# Patient Record
Sex: Male | Born: 1990 | Race: White | Hispanic: No | Marital: Single | Smoking: Former smoker
Health system: Southern US, Community
[De-identification: ages and names within clinical notes are randomized; demographics above are authoritative.]

## PROBLEM LIST (undated history)

## (undated) DIAGNOSIS — F419 Anxiety disorder, unspecified: Secondary | ICD-10-CM

## (undated) DIAGNOSIS — L729 Follicular cyst of the skin and subcutaneous tissue, unspecified: Secondary | ICD-10-CM

## (undated) HISTORY — DX: Anxiety disorder, unspecified: F41.9

## (undated) HISTORY — PX: CYST EXCISION: SHX5701

---
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
## 2010-09-02 ENCOUNTER — Encounter: Payer: Self-pay | Admitting: Gastroenterology

## 2010-12-27 NOTE — Consult Note (Signed)
[name], [name]                     ACCOUNT NO.:  [id number]   MEDICAL RECORD NO.:  [id number]                   PATIENT TYPE:  EMS   LOCATION:  [facility]                                 FACILITY:  [facility]   PHYSICIAN:  Gabrielle Dare. Janee Morn, M.D.             DATE OF BIRTH:  [birth year]   DATE OF CONSULTATION:  01/06/2003  DATE OF DISCHARGE:                                   CONSULTATION   REASON FOR CONSULTATION:  Closed head injury, status post skateboard fall.   HISTORY OF PRESENT ILLNESS:  The patient is an 20 year old white male who  was riding a skateboard before school this morning and fell down onto his  right shoulder and side.  The event was witnessed and he did not have loss  of consciousness but he is amnestic to the event at this time.  Currently he  is complaining of just right shoulder pain.  He did have some nausea and  vomiting in the emergency department and during his evaluation.  He was  found to have a right clavicle fracture and a CT scan of his head  demonstrated a small left temporoparietal subarachnoid hemorrhage.  The  patient's father is present and assisting with the history.   PAST MEDICAL HISTORY:  Negative.   PRIMARY CARE PHYSICIAN:  Tama Headings. Marina Goodell, M.D., [facility],  [place]   FAMILY HISTORY:  Both his parents are alive and well.   PAST SURGICAL HISTORY:  Negative.   MEDICATIONS AT HOME:  None.  His tetanus vaccination is up to date.   ALLERGIES:  No known drug allergies.   REVIEW OF SYSTEMS:  GENERAL:  Negative.  CARDIOVASCULAR:  Negative.  PULMONARY:  Negative.  GASTROINTESTINAL:  Negative.  MUSCULOSKELETAL:  He is  complaining of some right shoulder and right-sided pain.  The remainder of  the review of systems is negative.   PHYSICAL EXAMINATION:  VITAL SIGNS:  Pulse 67, blood pressure 132/68,  respirations 18, temperature 97.3, oxygen saturation 96% on room air.  SKIN:  Warm.  HEENT:  His pupils are equal and  reactive.  His extraocular muscles are  intact.  Note the pupils are 3 mm bilaterally.  The ears are normal  externally.  His scalp noted no lacerations.  NECK:  Nontender.  Trachea is in the midline.  CHEST:  Clear to auscultation bilaterally.  He has a 2-3 cm hematoma over  his right clavicle with some tenderness.  HEART:  Regular rate and rhythm.  ABDOMEN:  Soft and nontender with normal bowel sounds.  He has a right lower  quadrant abrasion and over his anterior superior iliac spine with some  minimal tenderness.  No active bleeding.  BACK:  No stepoffs but he does have some scattered abrasions over his right  posterior shoulder and back without any active hemorrhage.  RECTAL:  Deferred.  No external wound.  GENITALIA:  Normal external genitalia.  PELVIS:  Stable  to palpation.  EXTREMITIES:  He has some abrasions around his right elbow but no specific  bony point tenderness.  He has good range of motion.  NEUROLOGICAL:  GCS 15.  He is oriented but amnestic to the event.  Cranial  nerves II-XII intact.  Sensation and motor of the upper and lower  extremities are intact.  VASCULAR:  Carotid, radial and dorsalis pedis pulses are 2+ bilaterally.   LABORATORY DATA:  Review of the i-STAT is pending.  Chest x-ray and right  shoulder x-ray reveal a right clavicle fracture.  CT scan of his head shows  a left temporoparietal subarachnoid hemorrhage.   IMPRESSION:  This is an 20 year old white male status post fall from  skateboard with a right clavicle fracture and a small left temporoparietal  subarachnoid hemorrhage.  The above neurosurgical issue is discussed with  Dr. Maeola Harman from Neurosurgery who evaluated the patient's CT scan and  recommends transfer to [facility] for definitive pediatric neurosurgical care.  I discussed this myself with Dr. Vista Lawman, the receiving pediatric trauma  surgeon at [facility], who has directed [place] to transfer the patient to the  pediatric emergency  department there at [facility].  The patient's  father was updated with the plan of care and questions were answered.                                               Gabrielle Dare Janee Morn, M.D.    BET/MEDQ  D:  01/06/2003  T:  01/06/2003  Job:  [id number]

## 2011-01-23 ENCOUNTER — Other Ambulatory Visit: Payer: Self-pay | Admitting: Plastic Surgery

## 2011-01-23 ENCOUNTER — Ambulatory Visit (HOSPITAL_BASED_OUTPATIENT_CLINIC_OR_DEPARTMENT_OTHER)
Admission: RE | Admit: 2011-01-23 | Discharge: 2011-01-23 | Disposition: A | Discharge status: Home / Self Care | Payer: BC Managed Care – PPO | Source: Ambulatory Visit | Attending: Plastic Surgery | Admitting: Plastic Surgery

## 2011-01-23 DIAGNOSIS — Z01812 Encounter for preprocedural laboratory examination: Secondary | ICD-10-CM | POA: Insufficient documentation

## 2011-01-23 DIAGNOSIS — D1801 Hemangioma of skin and subcutaneous tissue: Secondary | ICD-10-CM | POA: Insufficient documentation

## 2011-01-23 DIAGNOSIS — L723 Sebaceous cyst: Secondary | ICD-10-CM | POA: Insufficient documentation

## 2011-01-23 DIAGNOSIS — D492 Neoplasm of unspecified behavior of bone, soft tissue, and skin: Secondary | ICD-10-CM | POA: Insufficient documentation

## 2011-01-23 LAB — POCT HEMOGLOBIN-HEMACUE: Hemoglobin: 17 g/dL (ref 13.0–17.0)

## 2011-01-26 LAB — TISSUE CULTURE: Culture: NO GROWTH

## 2011-01-26 LAB — WOUND CULTURE

## 2011-01-28 LAB — ANAEROBIC CULTURE

## 2011-07-08 DIAGNOSIS — L700 Acne vulgaris: Secondary | ICD-10-CM | POA: Insufficient documentation

## 2011-08-12 DIAGNOSIS — K460 Unspecified abdominal hernia with obstruction, without gangrene: Secondary | ICD-10-CM

## 2011-08-12 HISTORY — DX: Unspecified abdominal hernia with obstruction, without gangrene: K46.0

## 2012-09-13 ENCOUNTER — Encounter (INDEPENDENT_AMBULATORY_CARE_PROVIDER_SITE_OTHER): Payer: Self-pay | Admitting: Surgery

## 2012-09-13 ENCOUNTER — Ambulatory Visit (INDEPENDENT_AMBULATORY_CARE_PROVIDER_SITE_OTHER): Payer: BC Managed Care – PPO | Admitting: Surgery

## 2012-09-13 VITALS — BP 102/64 | HR 81 | Temp 97.7°F | Resp 18 | Ht 71.0 in | Wt 150.6 lb

## 2012-09-13 DIAGNOSIS — K4090 Unilateral inguinal hernia, without obstruction or gangrene, not specified as recurrent: Secondary | ICD-10-CM

## 2012-09-13 NOTE — Patient Instructions (Signed)

## 2012-09-13 NOTE — Progress Notes (Signed)
Patient ID: [name], male   DOB: 11/16/1990, 21 y.o.   MRN: [id number]  Chief Complaint  Patient presents with  . New Evaluation    eval ing hernia    HPI [name] is a 21 y.o. male.  Patient sent at request of Dr. Pickett of [facility]  for left groin bulge and pain. He developed pain in his left groin about 2 weeks ago while doing some lifting. The bulge continues to be present and its large or with exertion. He has mild to moderate discomfort in his left groin sharp in nature. This is made worse with straining. HPI  History reviewed. No pertinent past medical history.  Past Surgical History  Procedure Date  . Cyst excision 2011/2012    History reviewed. No pertinent family history.  Social History History  Substance Use Topics  . Smoking status: Current Every Day Smoker -- 0.2 packs/day    Types: Cigarettes  . Smokeless tobacco: Never Used  . Alcohol Use: Yes     Comment: Social    No Known Allergies  Current Outpatient Prescriptions  Medication Sig Dispense Refill  . doxycycline (VIBRAMYCIN) 100 MG capsule         Review of Systems Review of Systems  Constitutional: Negative for fever, chills and unexpected weight change.  HENT: Negative for hearing loss, congestion, sore throat, trouble swallowing and voice change.   Eyes: Negative for visual disturbance.  Respiratory: Negative for cough and wheezing.   Cardiovascular: Negative for chest pain, palpitations and leg swelling.  Gastrointestinal: Negative for nausea, vomiting, abdominal pain, diarrhea, constipation, blood in stool, abdominal distention, anal bleeding and rectal pain.  Genitourinary: Negative for hematuria and difficulty urinating.  Musculoskeletal: Negative for arthralgias.  Skin: Negative for rash and wound.  Neurological: Negative for seizures, syncope, weakness and headaches.  Hematological: Negative for adenopathy. Does not bruise/bleed easily.  Psychiatric/Behavioral: Negative  for confusion.    Blood pressure 102/64, pulse 81, temperature 97.7 F (36.5 C), temperature source Temporal, resp. rate 18, height 5' 11" (1.803 m), weight 150 lb 9.6 oz (68.312 kg).  Physical Exam Physical Exam  Constitutional: He is oriented to person, place, and time. He appears well-developed and well-nourished.  HENT:  Head: Normocephalic and atraumatic.  Eyes: EOM are normal. Pupils are equal, round, and reactive to light.  Neck: Normal range of motion. Neck supple.  Cardiovascular: Normal rate and regular rhythm.   Pulmonary/Chest: Effort normal and breath sounds normal.  Abdominal: Soft. Bowel sounds are normal. A hernia is present.    Musculoskeletal: Normal range of motion.  Neurological: He is alert and oriented to person, place, and time.  Skin: Skin is warm and dry.  Psychiatric: He has a normal mood and affect. His behavior is normal. Judgment and thought content normal.    Data Reviewed Office note from [facility]  Assessment    Left inguinal hernia reducible symptomatic    Plan    Recommend repair left inguinal hernia with mesh. Laparoscopic and open repair discussed. Risk of each discussed. Complications of these discussed. He wishes to proceed with open left inguinal hernia repair with mesh.The risk of hernia repair include bleeding,  Infection,   Recurrence of the hernia,  Mesh use, chronic pain,  Organ injury,  Bowel injury,  Bladder injury,   nerve injury with numbness around the incision,  Death,  and worsening of preexisting  medical problems.  The alternatives to surgery have been discussed as well..  Long term expectations of both   operative and non operative treatments have been discussed.   The patient agrees to proceed.       Heela Heishman A. 09/13/2012, 2:05 PM    

## 2012-09-14 ENCOUNTER — Encounter (HOSPITAL_COMMUNITY): Payer: Self-pay | Admitting: Pharmacy Technician

## 2012-09-15 ENCOUNTER — Encounter (HOSPITAL_COMMUNITY)
Admission: RE | Admit: 2012-09-15 | Discharge: 2012-09-15 | Disposition: A | Discharge status: Home / Self Care | Payer: BC Managed Care – PPO | Source: Ambulatory Visit | Attending: Surgery | Admitting: Surgery

## 2012-09-15 ENCOUNTER — Encounter (HOSPITAL_COMMUNITY): Payer: Self-pay

## 2012-09-15 DIAGNOSIS — L729 Follicular cyst of the skin and subcutaneous tissue, unspecified: Secondary | ICD-10-CM

## 2012-09-15 HISTORY — DX: Follicular cyst of the skin and subcutaneous tissue, unspecified: L72.9

## 2012-09-15 LAB — SURGICAL PCR SCREEN
MRSA, PCR: NEGATIVE
Staphylococcus aureus: NEGATIVE

## 2012-09-15 NOTE — Patient Instructions (Addendum)
20 [name]  09/15/2012   Your procedure is scheduled on: 2-14  -2014  Report to [facility] at      0700  AM .  Call this number if you have problems the morning of surgery: [phone]  Or Presurgical Testing [phone]([place])      Do not eat food:After Midnight.    Take these medicines the morning of surgery with A SIP OF WATER: none   Do not wear jewelry, make-up or nail polish.  Do not wear lotions, powders, or perfumes. You may wear deodorant.  Do not shave 12 hours prior to first CHG shower(legs and under arms).(face and neck okay.)  Do not bring valuables to the hospital.  Contacts, dentures or bridgework,body piercing,  may not be worn into surgery.  Leave suitcase in the car. After surgery it may be brought to your room.  For patients admitted to the hospital, checkout time is 11:00 AM the day of discharge.   Patients discharged the day of surgery will not be allowed to drive home. Must have responsible person with you x 24 hours once discharged.  Name and phone number of your driver: parents-Robert/ Roney Youtz- 161- [phone]/ [phone] cell  Special Instructions: CHG Shower Use Special Wash: see special instructions.(avoid face and genitals)   Please read over the following fact sheets that you were given: MRSA Information.   Failure to follow these instructions may result in Cancellation of your surgery.   Patient signature_______________________________________________________

## 2012-09-24 ENCOUNTER — Encounter (HOSPITAL_COMMUNITY): Payer: Self-pay

## 2012-09-24 ENCOUNTER — Ambulatory Visit (HOSPITAL_COMMUNITY)
Admission: RE | Admit: 2012-09-24 | Discharge: 2012-09-24 | Disposition: A | Discharge status: Home / Self Care | Payer: BC Managed Care – PPO | Source: Ambulatory Visit | Attending: Surgery | Admitting: Surgery

## 2012-09-24 ENCOUNTER — Encounter (HOSPITAL_COMMUNITY): Payer: Self-pay | Admitting: Anesthesiology

## 2012-09-24 ENCOUNTER — Encounter (HOSPITAL_COMMUNITY)
Admission: RE | Disposition: A | Discharge status: Home / Self Care | Payer: Self-pay | Source: Ambulatory Visit | Attending: Surgery

## 2012-09-24 ENCOUNTER — Ambulatory Visit (HOSPITAL_COMMUNITY): Payer: BC Managed Care – PPO | Admitting: Anesthesiology

## 2012-09-24 DIAGNOSIS — K4090 Unilateral inguinal hernia, without obstruction or gangrene, not specified as recurrent: Secondary | ICD-10-CM

## 2012-09-24 HISTORY — PX: INGUINAL HERNIA REPAIR: SHX194

## 2012-09-24 HISTORY — PX: INSERTION OF MESH: SHX5868

## 2012-09-24 SURGERY — REPAIR, HERNIA, INGUINAL, ADULT
Anesthesia: General | Site: Groin | Laterality: Left | Wound class: Clean

## 2012-09-24 MED ORDER — FENTANYL CITRATE 0.05 MG/ML IJ SOLN
INTRAMUSCULAR | Status: DC | PRN
  Administered 2012-09-24: 100 ug via INTRAVENOUS
  Administered 2012-09-24 (×2): 50 ug via INTRAVENOUS

## 2012-09-24 MED ORDER — LACTATED RINGERS IV SOLN: INTRAVENOUS | Status: DC

## 2012-09-24 MED ORDER — ONDANSETRON HCL 4 MG/2ML IJ SOLN
INTRAMUSCULAR | Status: DC | PRN
  Administered 2012-09-24: 4 mg via INTRAVENOUS

## 2012-09-24 MED ORDER — FENTANYL CITRATE 0.05 MG/ML IJ SOLN
INTRAMUSCULAR | Status: AC
  Filled 2012-09-24: qty 2

## 2012-09-24 MED ORDER — ACETAMINOPHEN 10 MG/ML IV SOLN
INTRAVENOUS | Status: AC
  Filled 2012-09-24: qty 100

## 2012-09-24 MED ORDER — OXYCODONE-ACETAMINOPHEN 5-325 MG PO TABS: 1.0000 | ORAL_TABLET | ORAL | Status: DC | PRN

## 2012-09-24 MED ORDER — NEOSTIGMINE METHYLSULFATE 1 MG/ML IJ SOLN
INTRAMUSCULAR | Status: DC | PRN
  Administered 2012-09-24: 3 mg via INTRAVENOUS

## 2012-09-24 MED ORDER — BUPIVACAINE-EPINEPHRINE 0.25% -1:200000 IJ SOLN
INTRAMUSCULAR | Status: DC | PRN
  Administered 2012-09-24: 10 mL

## 2012-09-24 MED ORDER — SODIUM CHLORIDE 0.9 % IR SOLN
Status: DC | PRN
  Administered 2012-09-24: 1000 mL

## 2012-09-24 MED ORDER — PROPOFOL 10 MG/ML IV BOLUS
INTRAVENOUS | Status: DC | PRN
  Administered 2012-09-24: 170 mg via INTRAVENOUS

## 2012-09-24 MED ORDER — FENTANYL CITRATE 0.05 MG/ML IJ SOLN
25.0000 ug | INTRAMUSCULAR | Status: DC | PRN
  Administered 2012-09-24 (×2): 25 ug via INTRAVENOUS

## 2012-09-24 MED ORDER — LACTATED RINGERS IV SOLN
INTRAVENOUS | Status: DC | PRN
  Administered 2012-09-24 (×2): via INTRAVENOUS

## 2012-09-24 MED ORDER — CHLORHEXIDINE GLUCONATE 4 % EX LIQD
1.0000 "application " | Freq: Once | CUTANEOUS | Status: DC
  Filled 2012-09-24: qty 15

## 2012-09-24 MED ORDER — KETOROLAC TROMETHAMINE 30 MG/ML IJ SOLN
INTRAMUSCULAR | Status: DC | PRN
  Administered 2012-09-24: 30 mg via INTRAVENOUS

## 2012-09-24 MED ORDER — CEFAZOLIN SODIUM-DEXTROSE 2-3 GM-% IV SOLR
INTRAVENOUS | Status: AC
  Filled 2012-09-24: qty 50

## 2012-09-24 MED ORDER — OXYCODONE-ACETAMINOPHEN 5-325 MG PO TABS
1.0000 | ORAL_TABLET | ORAL | Status: DC | PRN
  Administered 2012-09-24: 1 via ORAL
  Filled 2012-09-24: qty 1

## 2012-09-24 MED ORDER — ACETAMINOPHEN 10 MG/ML IV SOLN
INTRAVENOUS | Status: DC | PRN
  Administered 2012-09-24: 1000 mg via INTRAVENOUS

## 2012-09-24 MED ORDER — MIDAZOLAM HCL 5 MG/5ML IJ SOLN
INTRAMUSCULAR | Status: DC | PRN
  Administered 2012-09-24: 2 mg via INTRAVENOUS

## 2012-09-24 MED ORDER — LIDOCAINE HCL (PF) 2 % IJ SOLN
INTRAMUSCULAR | Status: DC | PRN
  Administered 2012-09-24: 20 mg

## 2012-09-24 MED ORDER — SUCCINYLCHOLINE CHLORIDE 20 MG/ML IJ SOLN
INTRAMUSCULAR | Status: DC | PRN
  Administered 2012-09-24: 100 mg via INTRAVENOUS

## 2012-09-24 MED ORDER — CEFAZOLIN SODIUM-DEXTROSE 2-3 GM-% IV SOLR
2.0000 g | INTRAVENOUS | Status: AC
  Administered 2012-09-24: 2 g via INTRAVENOUS

## 2012-09-24 MED ORDER — BUPIVACAINE-EPINEPHRINE 0.25% -1:200000 IJ SOLN
INTRAMUSCULAR | Status: AC
  Filled 2012-09-24: qty 1

## 2012-09-24 MED ORDER — TRAMADOL HCL 50 MG PO TABS: 50.0000 mg | ORAL_TABLET | Freq: Four times a day (QID) | ORAL | Status: DC | PRN

## 2012-09-24 MED ORDER — GLYCOPYRROLATE 0.2 MG/ML IJ SOLN
INTRAMUSCULAR | Status: DC | PRN
  Administered 2012-09-24: .5 mg via INTRAVENOUS

## 2012-09-24 MED ORDER — ROCURONIUM BROMIDE 100 MG/10ML IV SOLN
INTRAVENOUS | Status: DC | PRN
  Administered 2012-09-24: 10 mg via INTRAVENOUS
  Administered 2012-09-24: 20 mg via INTRAVENOUS

## 2012-09-24 SURGICAL SUPPLY — 50 items
ADH SKN CLS APL DERMABOND .7 (GAUZE/BANDAGES/DRESSINGS) ×1
APL SKNCLS STERI-STRIP NONHPOA (GAUZE/BANDAGES/DRESSINGS)
BENZOIN TINCTURE PRP APPL 2/3 (GAUZE/BANDAGES/DRESSINGS) IMPLANT
BLADE HEX COATED 2.75 (ELECTRODE) ×2 IMPLANT
BLADE SURG 15 STRL LF DISP TIS (BLADE) ×1 IMPLANT
BLADE SURG 15 STRL SS (BLADE) ×2
CANISTER SUCTION 2500CC (MISCELLANEOUS) ×1 IMPLANT
CLOTH BEACON ORANGE TIMEOUT ST (SAFETY) ×2 IMPLANT
DECANTER SPIKE VIAL GLASS SM (MISCELLANEOUS) ×2 IMPLANT
DERMABOND ADVANCED (GAUZE/BANDAGES/DRESSINGS) ×1
DERMABOND ADVANCED .7 DNX12 (GAUZE/BANDAGES/DRESSINGS) IMPLANT
DRAIN PENROSE 18X1/2 LTX STRL (DRAIN) ×2 IMPLANT
DRAPE LAPAROTOMY TRNSV 102X78 (DRAPE) ×2 IMPLANT
ELECT REM PT RETURN 9FT ADLT (ELECTROSURGICAL) ×2
ELECTRODE REM PT RTRN 9FT ADLT (ELECTROSURGICAL) ×1 IMPLANT
GLOVE BIOGEL PI IND STRL 7.0 (GLOVE) ×1 IMPLANT
GLOVE BIOGEL PI INDICATOR 7.0 (GLOVE) ×1
GLOVE INDICATOR 8.0 STRL GRN (GLOVE) ×3 IMPLANT
GLOVE SS BIOGEL STRL SZ 8 (GLOVE) ×1 IMPLANT
GLOVE SUPERSENSE BIOGEL SZ 8 (GLOVE) ×1
GOWN STRL NON-REIN LRG LVL3 (GOWN DISPOSABLE) ×2 IMPLANT
GOWN STRL REIN XL XLG (GOWN DISPOSABLE) ×4 IMPLANT
KIT BASIN OR (CUSTOM PROCEDURE TRAY) ×2 IMPLANT
MESH ULTRAPRO 3X6 7.6X15CM (Mesh General) ×1 IMPLANT
NDL HYPO 25X1 1.5 SAFETY (NEEDLE) ×1 IMPLANT
NEEDLE HYPO 25X1 1.5 SAFETY (NEEDLE) ×2 IMPLANT
NS IRRIG 1000ML POUR BTL (IV SOLUTION) ×2 IMPLANT
PACK BASIC VI WITH GOWN DISP (CUSTOM PROCEDURE TRAY) ×2 IMPLANT
PENCIL BUTTON HOLSTER BLD 10FT (ELECTRODE) ×2 IMPLANT
SPONGE GAUZE 4X4 12PLY (GAUZE/BANDAGES/DRESSINGS) IMPLANT
SPONGE LAP 18X18 X RAY DECT (DISPOSABLE) ×2 IMPLANT
STRIP CLOSURE SKIN 1/2X4 (GAUZE/BANDAGES/DRESSINGS) IMPLANT
SUT MNCRL AB 4-0 PS2 18 (SUTURE) ×2 IMPLANT
SUT NOVA 0 T19/GS 22DT (SUTURE) IMPLANT
SUT NOVA NAB GS-21 0 18 T12 DT (SUTURE) ×3 IMPLANT
SUT SILK 2 0 SH (SUTURE) IMPLANT
SUT SILK 3 0 (SUTURE)
SUT SILK 3-0 18XBRD TIE 12 (SUTURE) IMPLANT
SUT VIC AB 0 CT1 36 (SUTURE) ×2 IMPLANT
SUT VIC AB 2-0 SH 27 (SUTURE) ×2
SUT VIC AB 2-0 SH 27X BRD (SUTURE) ×1 IMPLANT
SUT VIC AB 3-0 SH 18 (SUTURE) ×2 IMPLANT
SUT VIC AB 3-0 SH 27 (SUTURE) ×2
SUT VIC AB 3-0 SH 27XBRD (SUTURE) ×1 IMPLANT
SUT VICRYL 3 0 BR 18  UND (SUTURE) ×1
SUT VICRYL 3 0 BR 18 UND (SUTURE) ×1 IMPLANT
SYR BULB IRRIGATION 50ML (SYRINGE) ×2 IMPLANT
SYR CONTROL 10ML LL (SYRINGE) ×2 IMPLANT
TOWEL OR 17X26 10 PK STRL BLUE (TOWEL DISPOSABLE) ×2 IMPLANT
YANKAUER SUCT BULB TIP 10FT TU (MISCELLANEOUS) ×2 IMPLANT

## 2012-09-24 NOTE — Anesthesia Preprocedure Evaluation (Addendum)
Anesthesia Evaluation  Patient identified by MRN, date of birth, ID band Patient awake    Reviewed: Allergy & Precautions, H&P , NPO status , Patient's Chart, lab work & pertinent test results  Airway Mallampati: II TM Distance: >3 FB Neck ROM: full    Dental no notable dental hx. (+) Teeth Intact, Dental Advisory Given and Missing One missing tooth lower right :   Pulmonary neg pulmonary ROS, Current Smoker,  breath sounds clear to auscultation  Pulmonary exam normal       Cardiovascular Exercise Tolerance: Good negative cardio ROS  Rhythm:regular Rate:Normal     Neuro/Psych negative neurological ROS  negative psych ROS   GI/Hepatic negative GI ROS, Neg liver ROS,   Endo/Other  negative endocrine ROS  Renal/GU negative Renal ROS  negative genitourinary   Musculoskeletal   Abdominal   Peds  Hematology negative hematology ROS (+)   Anesthesia Other Findings   Reproductive/Obstetrics negative OB ROS                          Anesthesia Physical Anesthesia Plan  ASA: II  Anesthesia Plan: General   Post-op Pain Management:    Induction: Intravenous  Airway Management Planned: Oral ETT  Additional Equipment:   Intra-op Plan:   Post-operative Plan: Extubation in OR  Informed Consent: I have reviewed the patients History and Physical, chart, labs and discussed the procedure including the risks, benefits and alternatives for the proposed anesthesia with the patient or authorized representative who has indicated his/her understanding and acceptance.   Dental Advisory Given  Plan Discussed with: CRNA and Surgeon  Anesthesia Plan Comments:        Anesthesia Quick Evaluation

## 2012-09-24 NOTE — H&P (View-Only) (Signed)
Patient ID: [name], male   DOB: [birth year], 22 y.o.   MRN: [id number]  Chief Complaint  Patient presents with  . New Evaluation    eval ing hernia    HPI [name] is a 22 y.o. male.  Patient sent at request of Dr. Haynes Dage of [facility]  for left groin bulge and pain. He developed pain in his left groin about 2 weeks ago while doing some lifting. The bulge continues to be present and its large or with exertion. He has mild to moderate discomfort in his left groin sharp in nature. This is made worse with straining. HPI  History reviewed. No pertinent past medical history.  Past Surgical History  Procedure Date  . Cyst excision 2011/2012    History reviewed. No pertinent family history.  Social History History  Substance Use Topics  . Smoking status: Current Every Day Smoker -- 0.2 packs/day    Types: Cigarettes  . Smokeless tobacco: Never Used  . Alcohol Use: Yes     Comment: Social    No Known Allergies  Current Outpatient Prescriptions  Medication Sig Dispense Refill  . doxycycline (VIBRAMYCIN) 100 MG capsule         Review of Systems Review of Systems  Constitutional: Negative for fever, chills and unexpected weight change.  HENT: Negative for hearing loss, congestion, sore throat, trouble swallowing and voice change.   Eyes: Negative for visual disturbance.  Respiratory: Negative for cough and wheezing.   Cardiovascular: Negative for chest pain, palpitations and leg swelling.  Gastrointestinal: Negative for nausea, vomiting, abdominal pain, diarrhea, constipation, blood in stool, abdominal distention, anal bleeding and rectal pain.  Genitourinary: Negative for hematuria and difficulty urinating.  Musculoskeletal: Negative for arthralgias.  Skin: Negative for rash and wound.  Neurological: Negative for seizures, syncope, weakness and headaches.  Hematological: Negative for adenopathy. Does not bruise/bleed easily.  Psychiatric/Behavioral: Negative  for confusion.    Blood pressure 102/64, pulse 81, temperature 97.7 F (36.5 C), temperature source Temporal, resp. rate 18, height 5\' 11"  (1.803 m), weight 150 lb 9.6 oz (68.312 kg).  Physical Exam Physical Exam  Constitutional: He is oriented to person, place, and time. He appears well-developed and well-nourished.  HENT:  Head: Normocephalic and atraumatic.  Eyes: EOM are normal. Pupils are equal, round, and reactive to light.  Neck: Normal range of motion. Neck supple.  Cardiovascular: Normal rate and regular rhythm.   Pulmonary/Chest: Effort normal and breath sounds normal.  Abdominal: Soft. Bowel sounds are normal. A hernia is present.    Musculoskeletal: Normal range of motion.  Neurological: He is alert and oriented to person, place, and time.  Skin: Skin is warm and dry.  Psychiatric: He has a normal mood and affect. His behavior is normal. Judgment and thought content normal.    Data Reviewed Office note from [facility]  Assessment    Left inguinal hernia reducible symptomatic    Plan    Recommend repair left inguinal hernia with mesh. Laparoscopic and open repair discussed. Risk of each discussed. Complications of these discussed. He wishes to proceed with open left inguinal hernia repair with mesh.The risk of hernia repair include bleeding,  Infection,   Recurrence of the hernia,  Mesh use, chronic pain,  Organ injury,  Bowel injury,  Bladder injury,   nerve injury with numbness around the incision,  Death,  and worsening of preexisting  medical problems.  The alternatives to surgery have been discussed as well..  Long term expectations of both  operative and non operative treatments have been discussed.   The patient agrees to proceed.       Raif Chachere A. 09/13/2012, 2:05 PM

## 2012-09-24 NOTE — Transfer of Care (Signed)
Immediate Anesthesia Transfer of Care Note  Patient: [name]  Procedure(s) Performed: Procedure(s) (LRB): HERNIA REPAIR INGUINAL ADULT (Left) INSERTION OF MESH (Left)  Patient Location: PACU  Anesthesia Type: General  Level of Consciousness: sedated, patient cooperative and responds to stimulaton  Airway & Oxygen Therapy: Patient Spontanous Breathing and Patient connected to face mask oxgen  Post-op Assessment: Report given to PACU RN and Post -op Vital signs reviewed and stable  Post vital signs: Reviewed and stable  Complications: No apparent anesthesia complications

## 2012-09-24 NOTE — Preoperative (Signed)
Beta Blockers   Reason not to administer Beta Blockers:Not Applicable 

## 2012-09-24 NOTE — Op Note (Signed)
Left  Inguinal Hernia, Open, Procedure Note with mesh Ultrapro  Indications: The patient presented with a history of a left inguinal hernia.  The risk of hernia repair include bleeding,  Infection,   Recurrence of the hernia,  Mesh use, chronic pain,  Organ injury,  Bowel injury,  Bladder injury,   nerve injury with numbness around the incision,  Death,  and worsening of preexisting  medical problems.  The alternatives to surgery have been discussed as well..  Long term expectations of both operative and non operative treatments have been discussed.   The patient agrees to proceed.    Pre-operative Diagnosis: left inguinal hernia reducible   Post-operative Diagnosis: same  Surgeon: Harriette Bouillon A.   Assistants: OR staff  Anesthesia: General endotracheal anesthesia and Local anesthesia 0.25.% bupivacaine, with epinephrine  ASA Class: 1  Procedure Details  The patient was seen again in the Holding Room. The risks, benefits, complications, treatment options, and expected outcomes were discussed with the patient. The possibilities of reaction to medication, pulmonary aspiration, perforation of viscus, bleeding, recurrent infection, the need for additional procedures, and development of a complication requiring transfusion or further operation were discussed with the patient and/or family. There was concurrence with the proposed plan, and informed consent was obtained. The site of surgery was properly noted/marked. The patient was taken to the Operating Room, identified as [name], and the procedure verified as hernia repair. A Time Out was held and the above information confirmed.  The patient was placed in the supine position and underwent induction of anesthesia, the lower abdomen and groin was prepped and draped in the standard fashion, and 0.25% Marcaine with epinephrine was used to anesthetize the skin over the mid-portion of the inguinal canal. A transverse incision was made.  Dissection was carried through the soft tissue to expose the inguinal canal and inguinal ligament along its lower edge. The external oblique fascia was split along the course of its fibers, exposing the inguinal canal. The cord and nerve were looped using a Penrose drain and reflected out of the field. The defect was exposed.  The sac ligated with 2 O vicryl  and a piece of prolene ultrapro mesh was cut in keyhole configuration  and placed over the  Indirect defect. Interupted 2-0 novafil suture was then used  to repair the defect, with the suture being sewn from the pubic tubercle inferiorly and superiorly along the canal to a level just beyond the internal ring. The mesh was split to allow passage of the cord and the canal without entrapment. Ilioinguinal nerve was trapped by mesh and it was divided.  The contents were then returned to canal and the external oblique fashion was then closed in a continuous fashion using 3-0 Vicryl suture taking care not to cause entrapment. Scarpa's layer closed with 3 0 vicryl and 4 0 monocryl used to close the skin.  Dermabond used for dressing.  Instrument, sponge, and needle counts were correct prior to closure and at the conclusion of the case.  Findings: Hernia as above  Estimated Blood Loss: Minimal         Drains: None         Total IV Fluids: 1200 mL         Specimens: none                Complications: None; patient tolerated the procedure well.         Disposition: PACU - hemodynamically stable.  Condition: stable

## 2012-09-24 NOTE — Interval H&P Note (Signed)
History and Physical Interval Note:  09/24/2012 8:29 AM  [name]  has presented today for surgery, with the diagnosis of left inguinal hernia  The various methods of treatment have been discussed with the patient and family. After consideration of risks, benefits and other options for treatment, the patient has consented to  Procedure(s): HERNIA REPAIR INGUINAL ADULT (Left) INSERTION OF MESH (Left) as a surgical intervention .  The patient's history has been reviewed, patient examined, no change in status, stable for surgery.  I have reviewed the patient's chart and labs.  Questions were answered to the patient's satisfaction.     [name] A.

## 2012-09-24 NOTE — Anesthesia Postprocedure Evaluation (Signed)
  Anesthesia Post-op Note  Patient: [name]  Procedure(s) Performed: Procedure(s) (LRB): HERNIA REPAIR INGUINAL ADULT (Left) INSERTION OF MESH (Left)  Patient Location: PACU  Anesthesia Type: General  Level of Consciousness: awake and alert   Airway and Oxygen Therapy: Patient Spontanous Breathing  Post-op Pain: mild  Post-op Assessment: Post-op Vital signs reviewed, Patient's Cardiovascular Status Stable, Respiratory Function Stable, Patent Airway and No signs of Nausea or vomiting  Last Vitals:  Filed Vitals:   09/24/12 1116  BP: 131/68  Pulse: 52  Temp: 36.4 C  Resp: 16    Post-op Vital Signs: stable   Complications: No apparent anesthesia complications

## 2012-09-27 ENCOUNTER — Encounter (HOSPITAL_COMMUNITY): Payer: Self-pay | Admitting: Surgery

## 2012-10-04 ENCOUNTER — Encounter (INDEPENDENT_AMBULATORY_CARE_PROVIDER_SITE_OTHER): Payer: Self-pay | Admitting: Surgery

## 2012-10-04 ENCOUNTER — Ambulatory Visit (INDEPENDENT_AMBULATORY_CARE_PROVIDER_SITE_OTHER): Payer: BC Managed Care – PPO | Admitting: Surgery

## 2012-10-04 VITALS — BP 124/72 | HR 64 | Temp 98.8°F | Resp 16 | Ht 71.0 in | Wt 143.8 lb

## 2012-10-04 DIAGNOSIS — Z9889 Other specified postprocedural states: Secondary | ICD-10-CM

## 2012-10-04 NOTE — Progress Notes (Signed)
Pt returns today after [facility]  repair.  Pain is well controlled.  Bowels are functioning.  Wound is clean.  On exam:  Incision is clean /dry/intact.  Area is soft without signs of hernia recurrence.  Impression:  Status repair of hernia RIH   Plan:  RTC PRN  Return to work in   2  Weeks.

## 2012-10-04 NOTE — Patient Instructions (Signed)
Return full duty in 2 weeks.

## 2014-08-03 ENCOUNTER — Encounter (HOSPITAL_COMMUNITY): Payer: Self-pay | Admitting: Emergency Medicine

## 2014-08-03 ENCOUNTER — Emergency Department (HOSPITAL_COMMUNITY)
Admission: EM | Admit: 2014-08-03 | Discharge: 2014-08-03 | Disposition: A | Discharge status: Home / Self Care | Payer: BC Managed Care – PPO | Attending: Emergency Medicine | Admitting: Emergency Medicine

## 2014-08-03 DIAGNOSIS — Z720 Tobacco use: Secondary | ICD-10-CM | POA: Insufficient documentation

## 2014-08-03 DIAGNOSIS — Z872 Personal history of diseases of the skin and subcutaneous tissue: Secondary | ICD-10-CM | POA: Insufficient documentation

## 2014-08-03 DIAGNOSIS — Z79899 Other long term (current) drug therapy: Secondary | ICD-10-CM | POA: Insufficient documentation

## 2014-08-03 DIAGNOSIS — T1501XA Foreign body in cornea, right eye, initial encounter: Secondary | ICD-10-CM | POA: Insufficient documentation

## 2014-08-03 DIAGNOSIS — Y998 Other external cause status: Secondary | ICD-10-CM | POA: Insufficient documentation

## 2014-08-03 DIAGNOSIS — Y9289 Other specified places as the place of occurrence of the external cause: Secondary | ICD-10-CM | POA: Insufficient documentation

## 2014-08-03 DIAGNOSIS — Y9389 Activity, other specified: Secondary | ICD-10-CM | POA: Insufficient documentation

## 2014-08-03 DIAGNOSIS — T1591XA Foreign body on external eye, part unspecified, right eye, initial encounter: Secondary | ICD-10-CM

## 2014-08-03 DIAGNOSIS — X58XXXA Exposure to other specified factors, initial encounter: Secondary | ICD-10-CM | POA: Insufficient documentation

## 2014-08-03 DIAGNOSIS — S0501XA Injury of conjunctiva and corneal abrasion without foreign body, right eye, initial encounter: Secondary | ICD-10-CM

## 2014-08-03 MED ORDER — IBUPROFEN 600 MG PO TABS: 600.0000 mg | ORAL_TABLET | Freq: Four times a day (QID) | ORAL | Status: DC | PRN

## 2014-08-03 MED ORDER — PROPARACAINE HCL 0.5 % OP SOLN
1.0000 [drp] | Freq: Once | OPHTHALMIC | Status: AC
  Administered 2014-08-03: 1 [drp] via OPHTHALMIC
  Filled 2014-08-03: qty 15

## 2014-08-03 MED ORDER — FLUORESCEIN SODIUM 1 MG OP STRP
1.0000 | ORAL_STRIP | Freq: Once | OPHTHALMIC | Status: AC
  Administered 2014-08-03: 1 via OPHTHALMIC
  Filled 2014-08-03: qty 1

## 2014-08-03 MED ORDER — OXYCODONE-ACETAMINOPHEN 5-325 MG PO TABS: 2.0000 | ORAL_TABLET | Freq: Four times a day (QID) | ORAL | Status: DC | PRN

## 2014-08-03 MED ORDER — MOXIFLOXACIN HCL 0.5 % OP SOLN: 1.0000 [drp] | Freq: Three times a day (TID) | OPHTHALMIC | Status: DC

## 2014-08-03 NOTE — ED Provider Notes (Signed)
CSN: [id number]     Arrival date & time 08/03/14  1225 History  This chart was scribed for non-physician practitioner, Noland Fordyce, PA-C,working with Pamella Pert, MD, by Marlowe Kays, ED Scribe. This patient was seen in room TR04C/TR04C and the patient's care was started at 1:36 PM.  Chief Complaint  Patient presents with  . Eye Pain   Patient is a 23 y.o. male presenting with eye pain. The history is provided by the patient. No language interpreter was used.  Eye Pain    HPI Comments:  [name] is a 23 y.o. male who presents to the Emergency Department complaining of severe right eye pain that began about three hours ago while cutting metal. He reports he was not wearing eye protection at the time. Pt went to his PCP and was diagnosed with a corneal abrasion and was told to report here since the PCP office did not have appropriate equipment for treatment. He denies modifying factors. Denies fever, chills, nausea, vomiting or visual loss. Denies wearing contacts or corrective lenses.   Past Medical History  Diagnosis Date  . Skin cysts, generalized 09-15-12    cyst at present right jaw(hx. of multiple)-tx. doxycycline   Past Surgical History  Procedure Laterality Date  . Cyst excision  2011/2012  . Inguinal hernia repair Left 09/24/2012    Procedure: HERNIA REPAIR INGUINAL ADULT;  Surgeon: Joyice Faster. Cornett, MD;  Location: [facility];  Service: General;  Laterality: Left;  . Insertion of mesh Left 09/24/2012    Procedure: INSERTION OF MESH;  Surgeon: Joyice Faster. Cornett, MD;  Location: [facility];  Service: General;  Laterality: Left;   History reviewed. No pertinent family history. History  Substance Use Topics  . Smoking status: Current Every Day Smoker -- 0.25 packs/day for 3 years    Types: Cigarettes  . Smokeless tobacco: Never Used  . Alcohol Use: 1.2 oz/week    2 Cans of beer per week     Comment: Social-beer 2 per weekend    Review of Systems  Constitutional:  Negative for fever and chills.  Eyes: Positive for pain and redness. Negative for visual disturbance (no visual loss).  Gastrointestinal: Negative for nausea and vomiting.  All other systems reviewed and are negative.   Allergies  Review of patient's allergies indicates no known allergies.  Home Medications   Prior to Admission medications   Medication Sig Start Date End Date Taking? Authorizing Provider  doxycycline (VIBRAMYCIN) 100 MG capsule Take 100 mg by mouth 2 (two) times daily.  09/06/12   Historical Provider, MD  ibuprofen (ADVIL,MOTRIN) 600 MG tablet Take 1 tablet (600 mg total) by mouth every 6 (six) hours as needed. 08/03/14   Noland Fordyce, PA-C  moxifloxacin (VIGAMOX) 0.5 % ophthalmic solution Place 1 drop into the right eye 3 (three) times daily. 08/03/14   Noland Fordyce, PA-C  oxyCODONE-acetaminophen (PERCOCET/ROXICET) 5-325 MG per tablet Take 2 tablets by mouth every 6 (six) hours as needed for moderate pain or severe pain. 08/03/14   Noland Fordyce, PA-C   Triage Vitals: BP 110/67 mmHg  Pulse 50  Temp(Src) 97.9 F (36.6 C) (Oral)  Resp 18  SpO2 98% Physical Exam  Constitutional: He is oriented to person, place, and time. He appears well-developed and well-nourished.  HENT:  Head: Normocephalic and atraumatic.  Eyes: EOM are normal. Pupils are equal, round, and reactive to light. Foreign body present in the right eye. Right conjunctiva is injected. Right conjunctiva has no hemorrhage.  Slit lamp  exam:      The right eye shows corneal abrasion and foreign body.    Visual Acuity  Right Eye Distance:   Left Eye Distance:   Bilateral Distance: 20/20  Right Eye Near:   Left Eye Near:    Bilateral Near:  20/20  Neck: Normal range of motion.  Cardiovascular: Normal rate.   Pulmonary/Chest: Effort normal.  Musculoskeletal: Normal range of motion.  Neurological: He is alert and oriented to person, place, and time.  Skin: Skin is warm and dry.  Psychiatric: He has a  normal mood and affect. His behavior is normal.  Nursing note and vitals reviewed.   ED Course  Procedures (including critical care time) DIAGNOSTIC STUDIES: Oxygen Saturation is 98% on RA, normal by my interpretation.   COORDINATION OF CARE: 1:40 PM- Will instill proparacaine drops and apply fluorescein strip to check for abrasion and foreign body. Pt verbalizes understanding and agrees to plan.  1:43 PM- Reports complete relief after administering proparacaine drops. Will prescribe antibiotic drops and referred to follow up with ophthalmologist.   Medications  proparacaine (ALCAINE) 0.5 % ophthalmic solution 1 drop (1 drop Right Eye Given 08/03/14 1339)  fluorescein ophthalmic strip 1 strip (1 strip Right Eye Given 08/03/14 1340)    Labs Review Labs Reviewed - No data to display  Imaging Review No results found.   EKG Interpretation None      MDM   Final diagnoses:  Foreign body of right eye, initial encounter  Corneal abrasion, right, initial encounter   Pt presenting to ED with dx of corneal abrasion from his PCP after cutting metal earlier.  On exam, pt does have a corneal abrasion to Right eye. Small fleck of suspected metal removed by cotton tip applicator from under right eye.  Right eye rinsed.  Discharged pt home with vigamox, percocet, and ibuprofen.   Advised to f/u on Monday with Dr. Anderson Malta, ophthalmologist on call or previously established optometrist. Home care instructions provided. Return precautions provided. Pt verbalized understanding and agreement with tx plan.   I personally performed the services described in this documentation, which was scribed in my presence. The recorded information has been reviewed and is accurate.    Noland Fordyce, PA-C 08/03/14 [place], MD 08/03/14 2037

## 2014-08-03 NOTE — ED Notes (Signed)
Pt c/o right eye pain after grinding metal today

## 2018-10-29 DIAGNOSIS — L03114 Cellulitis of left upper limb: Secondary | ICD-10-CM | POA: Diagnosis not present

## 2020-10-19 ENCOUNTER — Emergency Department (INDEPENDENT_AMBULATORY_CARE_PROVIDER_SITE_OTHER)
Admission: EM | Admit: 2020-10-19 | Discharge: 2020-10-19 | Disposition: A | Discharge status: Home / Self Care | Payer: PRIVATE HEALTH INSURANCE | Source: Home / Self Care | Attending: Internal Medicine | Admitting: Internal Medicine

## 2020-10-19 ENCOUNTER — Other Ambulatory Visit: Payer: Self-pay

## 2020-10-19 DIAGNOSIS — S61214A Laceration without foreign body of right ring finger without damage to nail, initial encounter: Secondary | ICD-10-CM | POA: Diagnosis not present

## 2020-10-19 DIAGNOSIS — Z23 Encounter for immunization: Secondary | ICD-10-CM

## 2020-10-19 MED ORDER — TETANUS-DIPHTH-ACELL PERTUSSIS 5-2.5-18.5 LF-MCG/0.5 IM SUSY
0.5000 mL | PREFILLED_SYRINGE | Freq: Once | INTRAMUSCULAR | Status: AC
  Administered 2020-10-19: 0.5 mL via INTRAMUSCULAR

## 2020-10-19 MED ORDER — ACETAMINOPHEN 325 MG PO TABS: 650.0000 mg | ORAL_TABLET | Freq: Four times a day (QID) | ORAL | Status: DC | PRN

## 2020-10-19 NOTE — ED Triage Notes (Signed)
Patient presents to Urgent Care with complaints of laceration to right 4th finger since about 2 hours ago. Patient reports he cut it on a sheet of metal, tetanus unknown. Bleeding controlled at this time, pt denies numbness or tingling in fingers distal to laceration.

## 2020-10-19 NOTE — Discharge Instructions (Signed)
Daily wound dressing changes with antibiotics Okay to wash with mild soap and water after 48 hours Return to urgent care for suture removal in 7 to 10 days If you observe any redness, swelling, discharge or worsening pain please return to the urgent care.

## 2020-10-19 NOTE — ED Provider Notes (Signed)
[name]    CSN: [id number] Arrival date & time: 10/19/20  1146      History   Chief Complaint Chief Complaint  Patient presents with  . Extremity Laceration    HPI [name] is a 30 y.o. male comes to urgent care today for laceration on the right ring finger.  Patient sustained the injury after he was cut by a sheet of metal.  Bleeding is controlled.  No swelling of the finger.  Tetanus vaccination is not updated.  No surrounding erythema.  HPI  Past Medical History:  Diagnosis Date  . Skin cysts, generalized 09-15-12   cyst at present right jaw(hx. of multiple)-tx. doxycycline    Patient Active Problem List   Diagnosis Date Noted  . Inguinal hernia unilateral, non-recurrent 09/13/2012    Past Surgical History:  Procedure Laterality Date  . CYST EXCISION  2011/2012  . INGUINAL HERNIA REPAIR Left 09/24/2012   Procedure: HERNIA REPAIR INGUINAL ADULT;  Surgeon: Joyice Faster. Cornett, MD;  Location: [facility];  Service: General;  Laterality: Left;  . INSERTION OF MESH Left 09/24/2012   Procedure: INSERTION OF MESH;  Surgeon: Joyice Faster. Cornett, MD;  Location: [facility];  Service: General;  Laterality: Left;       Home Medications    Prior to Admission medications   Medication Sig Start Date End Date Taking? Authorizing Provider  acetaminophen (TYLENOL) 325 MG tablet Take 2 tablets (650 mg total) by mouth every 6 (six) hours as needed. 10/19/20  Yes Kelsha Older, Myrene Galas, MD    Family History Family History  Problem Relation Age of Onset  . Healthy Mother   . Healthy Father     Social History Social History   Tobacco Use  . Smoking status: Former Smoker    Packs/day: 0.25    Years: 3.00    Pack years: 0.75    Types: Cigarettes    Quit date: 10/19/2013    Years since quitting: 7.0  . Smokeless tobacco: Never Used  Substance Use Topics  . Alcohol use: Yes    Alcohol/week: 2.0 standard drinks    Types: 2 Cans of beer per week    Comment:  Social-beer 2 per weekend  . Drug use: No     Allergies   Patient has no known allergies.   Review of Systems Review of Systems  Musculoskeletal: Positive for arthralgias.  Skin: Positive for wound. Negative for color change and rash.  Neurological: Negative.      Physical Exam Triage Vital Signs ED Triage Vitals  Enc Vitals Group     BP 10/19/20 1206 122/69     Pulse Rate 10/19/20 1206 76     Resp 10/19/20 1206 16     Temp 10/19/20 1206 98.1 F (36.7 C)     Temp Source 10/19/20 1206 Oral     SpO2 10/19/20 1206 99 %     Weight --      Height --      Head Circumference --      Peak Flow --      Pain Score 10/19/20 1203 3     Pain Loc --      Pain Edu? --      Excl. in [place]? --    No data found.  Updated Vital Signs BP 122/69 (BP Location: Left Arm)   Pulse 76   Temp 98.1 F (36.7 C) (Oral)   Resp 16   SpO2 99%   Visual Acuity Right Eye  Distance:   Left Eye Distance:   Bilateral Distance:    Right Eye Near:   Left Eye Near:    Bilateral Near:     Physical Exam Vitals and nursing note reviewed.  Constitutional:      General: He is not in acute distress.    Appearance: He is not ill-appearing.  Cardiovascular:     Rate and Rhythm: Normal rate and regular rhythm.  Skin:    General: Skin is warm.     Comments: Laceration over the right ring finger on the dorsal aspect.  The laceration is curvilinear with loose flap of tissue.  Tissue remains viable.  Neurological:     Mental Status: He is alert.      UC Treatments / Results  Labs (all labs ordered are listed, but only abnormal results are displayed) Labs Reviewed - No data to display  EKG   Radiology No results found.  Procedures Laceration Repair  Date/Time: 10/19/2020 3:50 PM Performed by: Chase Picket, MD Authorized by: Chase Picket, MD   Consent:    Consent obtained:  Verbal   Consent given by:  Patient   Risks discussed:  Infection and poor cosmetic result Universal  protocol:    Patient identity confirmed:  Verbally with patient Laceration details:    Location:  Finger   Finger location:  R ring finger   Length (cm):  2   Depth (mm):  5 Pre-procedure details:    Preparation:  Patient was prepped and draped in usual sterile fashion Exploration:    Imaging outcome: foreign body not noted     Wound exploration: wound explored through full range of motion   Treatment:    Area cleansed with:  Shur-Clens   Amount of cleaning:  Standard   Debridement:  None Skin repair:    Repair method:  Sutures   Suture size:  4-0   Suture material:  Prolene   Number of sutures:  4 Approximation:    Approximation:  Close Post-procedure details:    Dressing:  Antibiotic ointment and non-adherent dressing   Procedure completion:  Tolerated well, no immediate complications   (including critical care time)  Medications Ordered in UC Medications  Tdap (BOOSTRIX) injection 0.5 mL (0.5 mLs Intramuscular Given 10/19/20 1245)    Initial Impression / Assessment and Plan / UC Course  I have reviewed the triage vital signs and the nursing notes.  Pertinent labs & imaging results that were available during my care of the patient were reviewed by me and considered in my medical decision making (see chart for details).     1.  Laceration of the right ring finger: Laceration repaired Wound care recommendations given Tylenol/Motrin as needed for pain If patient notices swelling, redness, discharge-patient is encouraged to return to the urgent care to be reevaluated. Final Clinical Impressions(s) / UC Diagnoses   Final diagnoses:  Laceration of right ring finger without foreign body without damage to nail, initial encounter     Discharge Instructions     Daily wound dressing changes with antibiotics Okay to wash with mild soap and water after 48 hours Return to urgent care for suture removal in 7 to 10 days If you observe any redness, swelling, discharge or  worsening pain please return to the urgent care.   ED Prescriptions    Medication Sig Dispense Auth. Provider   acetaminophen (TYLENOL) 325 MG tablet Take 2 tablets (650 mg total) by mouth every 6 (six) hours as needed.  Katielynn Horan, Myrene Galas, MD     PDMP not reviewed this encounter.   Chase Picket, MD 10/19/20 [phone]

## 2021-03-29 ENCOUNTER — Observation Stay
Admission: EM | Admit: 2021-03-29 | Discharge: 2021-03-30 | Disposition: A | Discharge status: Home / Self Care | Payer: BLUE CROSS/BLUE SHIELD | Attending: Obstetrics and Gynecology | Admitting: Obstetrics and Gynecology

## 2021-03-29 ENCOUNTER — Observation Stay: Payer: BLUE CROSS/BLUE SHIELD

## 2021-03-29 ENCOUNTER — Other Ambulatory Visit: Payer: Self-pay

## 2021-03-29 ENCOUNTER — Emergency Department: Payer: BLUE CROSS/BLUE SHIELD

## 2021-03-29 DIAGNOSIS — F1290 Cannabis use, unspecified, uncomplicated: Secondary | ICD-10-CM | POA: Diagnosis not present

## 2021-03-29 DIAGNOSIS — K529 Noninfective gastroenteritis and colitis, unspecified: Principal | ICD-10-CM | POA: Diagnosis present

## 2021-03-29 DIAGNOSIS — R112 Nausea with vomiting, unspecified: Secondary | ICD-10-CM | POA: Diagnosis present

## 2021-03-29 DIAGNOSIS — Z20822 Contact with and (suspected) exposure to covid-19: Secondary | ICD-10-CM | POA: Diagnosis not present

## 2021-03-29 DIAGNOSIS — R1013 Epigastric pain: Secondary | ICD-10-CM

## 2021-03-29 DIAGNOSIS — K2100 Gastro-esophageal reflux disease with esophagitis, without bleeding: Secondary | ICD-10-CM

## 2021-03-29 LAB — CBC
HCT: 42.2 % (ref 39.0–52.0)
Hemoglobin: 14.6 g/dL (ref 13.0–17.0)
MCH: 28.1 pg (ref 26.0–34.0)
MCHC: 34.6 g/dL (ref 30.0–36.0)
MCV: 81.2 fL (ref 80.0–100.0)
Platelets: 374 10*3/uL (ref 150–400)
RBC: 5.2 MIL/uL (ref 4.22–5.81)
RDW: 13.2 % (ref 11.5–15.5)
WBC: 14.9 10*3/uL — ABNORMAL HIGH (ref 4.0–10.5)
nRBC: 0 % (ref 0.0–0.2)

## 2021-03-29 LAB — COMPREHENSIVE METABOLIC PANEL
ALT: 30 U/L (ref 0–44)
AST: 25 U/L (ref 15–41)
Albumin: 5 g/dL (ref 3.5–5.0)
Alkaline Phosphatase: 56 U/L (ref 38–126)
Anion gap: 16 — ABNORMAL HIGH (ref 5–15)
BUN: 19 mg/dL (ref 6–20)
CO2: 19 mmol/L — ABNORMAL LOW (ref 22–32)
Calcium: 9.5 mg/dL (ref 8.9–10.3)
Chloride: 102 mmol/L (ref 98–111)
Creatinine, Ser: 1.03 mg/dL (ref 0.61–1.24)
GFR, Estimated: >60 mL/min (ref >60)
Glucose, Bld: 173 mg/dL — ABNORMAL HIGH (ref 70–99)
Potassium: 3.5 mmol/L (ref 3.5–5.1)
Sodium: 137 mmol/L (ref 135–145)
Total Bilirubin: 1.9 mg/dL — ABNORMAL HIGH (ref 0.3–1.2)
Total Protein: 7.7 g/dL (ref 6.5–8.1)

## 2021-03-29 LAB — URINALYSIS, COMPLETE (UACMP) WITH MICROSCOPIC
Bacteria, UA: NONE SEEN
Bilirubin Urine: NEGATIVE
Glucose, UA: NEGATIVE mg/dL
Hgb urine dipstick: NEGATIVE
Ketones, ur: 80 mg/dL — AB
Leukocytes,Ua: NEGATIVE
Nitrite: NEGATIVE
Protein, ur: 30 mg/dL — AB
Specific Gravity, Urine: 1.033 — ABNORMAL HIGH (ref 1.005–1.030)
Squamous Epithelial / HPF: NONE SEEN (ref 0–5)
pH: 6 (ref 5.0–8.0)

## 2021-03-29 LAB — LIPASE, BLOOD: Lipase: 24 U/L (ref 11–51)

## 2021-03-29 LAB — RESP PANEL BY RT-PCR (FLU A&B, COVID) ARPGX2
Influenza A by PCR: NEGATIVE
Influenza B by PCR: NEGATIVE
SARS Coronavirus 2 by RT PCR: NEGATIVE

## 2021-03-29 MED ORDER — SUCRALFATE 1 G PO TABS
1.0000 g | ORAL_TABLET | Freq: Once | ORAL | Status: AC
  Administered 2021-03-29: 1 g via ORAL
  Filled 2021-03-29: qty 1

## 2021-03-29 MED ORDER — IOHEXOL 9 MG/ML PO SOLN
500.0000 mL | Freq: Once | ORAL | Status: DC | PRN
  Filled 2021-03-29: qty 500

## 2021-03-29 MED ORDER — ACETAMINOPHEN 325 MG PO TABS: 650.0000 mg | ORAL_TABLET | Freq: Four times a day (QID) | ORAL | Status: DC | PRN

## 2021-03-29 MED ORDER — ONDANSETRON 4 MG PO TBDP: 4.0000 mg | ORAL_TABLET | Freq: Three times a day (TID) | ORAL | 0 refills | Status: DC | PRN

## 2021-03-29 MED ORDER — ONDANSETRON 4 MG PO TBDP
4.0000 mg | ORAL_TABLET | Freq: Once | ORAL | Status: AC
  Administered 2021-03-29: 4 mg via ORAL
  Filled 2021-03-29: qty 1

## 2021-03-29 MED ORDER — LORAZEPAM 2 MG/ML IJ SOLN
0.5000 mg | Freq: Once | INTRAMUSCULAR | Status: AC
  Administered 2021-03-29: 0.5 mg via INTRAVENOUS
  Filled 2021-03-29: qty 1

## 2021-03-29 MED ORDER — ONDANSETRON HCL 4 MG PO TABS: 4.0000 mg | ORAL_TABLET | Freq: Four times a day (QID) | ORAL | Status: DC | PRN

## 2021-03-29 MED ORDER — OMEPRAZOLE MAGNESIUM 20 MG PO TBEC: 20.0000 mg | DELAYED_RELEASE_TABLET | Freq: Every day | ORAL | 1 refills | Status: DC

## 2021-03-29 MED ORDER — PANTOPRAZOLE SODIUM 40 MG IV SOLR: 40.0000 mg | INTRAVENOUS | Status: DC

## 2021-03-29 MED ORDER — SODIUM CHLORIDE 0.9 % IV SOLN
12.5000 mg | Freq: Once | INTRAVENOUS | Status: AC
  Administered 2021-03-29: 12.5 mg via INTRAVENOUS
  Filled 2021-03-29: qty 12.5

## 2021-03-29 MED ORDER — ONDANSETRON HCL 4 MG/2ML IJ SOLN: 4.0000 mg | Freq: Four times a day (QID) | INTRAMUSCULAR | Status: DC | PRN

## 2021-03-29 MED ORDER — SODIUM CHLORIDE 0.9 % IV SOLN: INTRAVENOUS | Status: DC

## 2021-03-29 MED ORDER — PANTOPRAZOLE SODIUM 40 MG IV SOLR
40.0000 mg | Freq: Once | INTRAVENOUS | Status: AC
  Administered 2021-03-29: 40 mg via INTRAVENOUS
  Filled 2021-03-29: qty 40

## 2021-03-29 MED ORDER — LIDOCAINE VISCOUS HCL 2 % MT SOLN
15.0000 mL | Freq: Once | OROMUCOSAL | Status: AC
  Administered 2021-03-29: 15 mL via ORAL
  Filled 2021-03-29: qty 15

## 2021-03-29 MED ORDER — HYDROXYZINE HCL 25 MG PO TABS
25.0000 mg | ORAL_TABLET | Freq: Every evening | ORAL | Status: DC | PRN
  Administered 2021-03-29: 25 mg via ORAL
  Filled 2021-03-29 (×2): qty 1

## 2021-03-29 MED ORDER — ENOXAPARIN SODIUM 40 MG/0.4ML IJ SOSY: 40.0000 mg | PREFILLED_SYRINGE | INTRAMUSCULAR | Status: DC

## 2021-03-29 MED ORDER — ALUM & MAG HYDROXIDE-SIMETH 200-200-20 MG/5ML PO SUSP
15.0000 mL | Freq: Once | ORAL | Status: AC
  Administered 2021-03-29: 15 mL via ORAL
  Filled 2021-03-29: qty 30

## 2021-03-29 MED ORDER — LIDOCAINE VISCOUS HCL 2 % MT SOLN: 15.0000 mL | OROMUCOSAL | 0 refills | Status: DC | PRN

## 2021-03-29 NOTE — ED Notes (Signed)
Informed RN bed assigned 

## 2021-03-29 NOTE — ED Provider Notes (Addendum)
[facility] Emergency Department Provider Note   ____________________________________________   Event Date/Time   First MD Initiated Contact with Patient 03/29/21 0919     (approximate)  I have reviewed the triage vital signs and the nursing notes.   HISTORY  Chief Complaint Emesis    HPI [name] is a 30 y.o. male with no significant past medical history presents to the ED complaining of nausea and vomiting.  Patient reports that he had spicy chili for dinner 2 nights ago, woke up the next morning with significant pain in his epigastrium along with nausea.  He describes numerous episodes of vomiting over the course of the day yesterday associated with diarrhea.  His emesis was dark and he was concerned there could be blood in it, but he denies any blood in his stool or dark tarry stool.  The pain in his epigastrium is described as sharp, radiating upwards into his throat, where he feels like there is a burning.  He has not had any fevers, cough, chest pain, or shortness of breath.  He received Zofran in triage and now states nausea along with epigastric pain have resolved, he continues to complain of burning pain in his throat.  He reports similar symptoms in the past but has never seen a provider for it.  He denies significant NSAID or alcohol use.        Past Medical History:  Diagnosis Date   Skin cysts, generalized 09-15-12   cyst at present right jaw(hx. of multiple)-tx. doxycycline    Patient Active Problem List   Diagnosis Date Noted   Inguinal hernia unilateral, non-recurrent 09/13/2012    Past Surgical History:  Procedure Laterality Date   CYST EXCISION  2011/2012   INGUINAL HERNIA REPAIR Left 09/24/2012   Procedure: HERNIA REPAIR INGUINAL ADULT;  Surgeon: Joyice Faster. Cornett, MD;  Location: [facility];  Service: General;  Laterality: Left;   INSERTION OF MESH Left 09/24/2012   Procedure: INSERTION OF MESH;  Surgeon: Joyice Faster. Cornett,  MD;  Location: [facility];  Service: General;  Laterality: Left;    Prior to Admission medications   Medication Sig Start Date End Date Taking? Authorizing Provider  lidocaine (XYLOCAINE) 2 % solution Use as directed 15 mLs in the mouth or throat every 4 (four) hours as needed for mouth pain. 03/29/21  Yes Blake Divine, MD  omeprazole (PRILOSEC OTC) 20 MG tablet Take 1 tablet (20 mg total) by mouth daily. 03/29/21 03/29/22 Yes Blake Divine, MD  ondansetron (ZOFRAN ODT) 4 MG disintegrating tablet Take 1 tablet (4 mg total) by mouth every 8 (eight) hours as needed for nausea or vomiting. 03/29/21  Yes Blake Divine, MD  acetaminophen (TYLENOL) 325 MG tablet Take 2 tablets (650 mg total) by mouth every 6 (six) hours as needed. 10/19/20   Lamptey, Myrene Galas, MD    Allergies Patient has no known allergies.  Family History  Problem Relation Age of Onset   Healthy Mother    Healthy Father     Social History Social History   Tobacco Use   Smoking status: Former    Packs/day: 0.25    Years: 3.00    Pack years: 0.75    Types: Cigarettes    Quit date: 10/19/2013    Years since quitting: 7.4   Smokeless tobacco: Never  Substance Use Topics   Alcohol use: Yes    Alcohol/week: 2.0 standard drinks    Types: 2 Cans of beer per week  Comment: Social-beer 2 per weekend   Drug use: No    Review of Systems  Constitutional: No fever/chills Eyes: No visual changes. ENT: No sore throat. Cardiovascular: Denies chest pain. Respiratory: Denies shortness of breath. Gastrointestinal: Positive for abdominal pain, nausea, vomiting, and diarrhea.  No constipation. Genitourinary: Negative for dysuria. Musculoskeletal: Negative for back pain. Skin: Negative for rash. Neurological: Negative for headaches, focal weakness or numbness.  ____________________________________________   PHYSICAL EXAM:  VITAL SIGNS: ED Triage Vitals  Enc Vitals Group     BP 03/29/21 0727 (!) 149/68     Pulse Rate  03/29/21 0727 99     Resp 03/29/21 0727 20     Temp 03/29/21 0727 98.3 F (36.8 C)     Temp Source 03/29/21 0727 Oral     SpO2 03/29/21 0727 98 %     Weight 03/29/21 0725 154 lb 5.2 oz (70 kg)     Height 03/29/21 0725 '5\' 11"'$  (1.803 m)     Head Circumference --      Peak Flow --      Pain Score 03/29/21 0729 8     Pain Loc --      Pain Edu? --      Excl. in [place]? --     Constitutional: Alert and oriented. Eyes: Conjunctivae are normal. Head: Atraumatic. Nose: No congestion/rhinnorhea. Mouth/Throat: Mucous membranes are moist. Neck: Normal ROM Cardiovascular: Normal rate, regular rhythm. Grossly normal heart sounds.  2+ radial pulses bilaterally. Respiratory: Normal respiratory effort.  No retractions. Lungs CTAB. Gastrointestinal: Soft and nontender. No distention. Genitourinary: deferred Musculoskeletal: No lower extremity tenderness nor edema. Neurologic:  Normal speech and language. No gross focal neurologic deficits are appreciated. Skin:  Skin is warm, dry and intact. No rash noted. Psychiatric: Mood and affect are normal. Speech and behavior are normal.  ____________________________________________   LABS (all labs ordered are listed, but only abnormal results are displayed)  Labs Reviewed  COMPREHENSIVE METABOLIC PANEL - Abnormal; Notable for the following components:      Result Value   CO2 19 (*)    Glucose, Bld 173 (*)    Total Bilirubin 1.9 (*)    Anion gap 16 (*)    All other components within normal limits  CBC - Abnormal; Notable for the following components:   WBC 14.9 (*)    All other components within normal limits  LIPASE, BLOOD  URINALYSIS, COMPLETE (UACMP) WITH MICROSCOPIC    PROCEDURES  Procedure(s) performed (including Critical Care):  Procedures   ____________________________________________   INITIAL IMPRESSION / ASSESSMENT AND PLAN / ED COURSE      30 year old male with no significant past medical history presents to the ED with  24 hours of epigastric pain moving up into his throat along with nausea and multiple episodes of vomiting with diarrhea.  He received Zofran in triage and now states his symptoms are much improved, abdominal exam is benign with no tenderness.  Symptoms seem consistent with PUD versus gastritis versus GERD and we will treat ongoing burning discomfort in his throat with a GI cocktail.  Labs are unremarkable and I doubt biliary pathology.  Patient has not been able to tolerate some water following administration of Zofran.  If he continues to be able to tolerate p.o. then he would be appropriate for discharge home with GI follow-up.  Patient feeling much better following GI cocktail, he has been able to tolerate crackers and peanut butter without difficulty.  He is appropriate for discharge home with GI  follow-up, we will prescribe omeprazole, Zofran, and viscous lidocaine.  He was counseled to return to the ED for new worsening symptoms, patient agrees with plan.  ----------------------------------------- 11:25 AM on 03/29/2021 ----------------------------------------- Unfortunately around the time of discharge patient developed recurrent nausea and vomiting.  We will place an IV and treat with IV Phenergan, further assess with right upper quadrant ultrasound.  ----------------------------------------- 3:26 PM on 03/29/2021 ----------------------------------------- Right upper quadrant ultrasound is unremarkable, patient turned over to Dr. Tamala Julian pending reassessment of his recurrent nausea and vomiting.      ____________________________________________   FINAL CLINICAL IMPRESSION(S) / ED DIAGNOSES  Final diagnoses:  Gastroesophageal reflux disease with esophagitis without hemorrhage  Non-intractable vomiting with nausea, unspecified vomiting type     ED Discharge Orders          Ordered    ondansetron (ZOFRAN ODT) 4 MG disintegrating tablet  Every 8 hours PRN        03/29/21 1056     omeprazole (PRILOSEC OTC) 20 MG tablet  Daily        03/29/21 1056    lidocaine (XYLOCAINE) 2 % solution  Every 4 hours PRN        03/29/21 1056             Note:  This document was prepared using Dragon voice recognition software and may include unintentional dictation errors.    Blake Divine, MD 03/29/21 [place]    Blake Divine, MD 03/29/21 [phone]

## 2021-03-29 NOTE — ED Notes (Signed)
Stated  He is really hungry--says he feels much better.  He is eating some saltines and peanut butter and drinking water.

## 2021-03-29 NOTE — ED Provider Notes (Signed)
I assumed care of this patient approximately 2 PM.  Please have providers note for full details regarding patient's initial evaluation assessment.  In brief patient presents for a little over 2 days of some epigastric discomfort associate with nonbloody nonbilious vomiting and diarrhea.  Offering provider concern is primarily for acute infectious gastroenteritis.  Labs and upper quadrant ultrasound largely unremarkable.  Patient given 8 of Zofran and 12.5 mg of Phenergan persistent nausea and vomiting with plan to reassess.  On reassessment patient is still having nausea and vomiting only able to take 1 sip of ginger ale before dry heaving again.  We will plan to admit to hospitalist service for intractable nausea and vomiting.  Will order IV fluids and pending EKG to assess patient's QTc interval prior to any additional potential QTC prolonging antiemetics.   Lucrezia Starch, MD 03/29/21 1447

## 2021-03-29 NOTE — ED Notes (Signed)
See triage note   Presents with epigastric pain and vomiting

## 2021-03-29 NOTE — ED Notes (Signed)
Pt became nauseated after eating some crackers  Additional meds given  Requesting to wait a few mins before leaving

## 2021-03-29 NOTE — H&P (Signed)
History and Physical    [name] [id number] DOB: 06-Jun-1991 DOA: 03/29/2021  PCP: Baruch Goldmann, PA-C   Patient coming from: Home  I have personally briefly reviewed patient's old medical records in [facility]  Chief Complaint: Abdominal pain  HPI: [name] is a 30 y.o. male with medical history significant for GERD, marijuana use who presents to the emergency room for evaluation of nausea and vomiting for over 24 hours associated with periumbilical pain which he rated 8 x 10 in intensity at its worst.  Pain is nonradiating.  He also has loose watery stools and denies having any sick contacts.  He denies any recent antibiotic use. Patient states that his symptoms started after he ate some spicy food. He has had chills but denies having any fever, no urinary symptoms, no chest pain, no shortness of breath, no dizziness, no lightheadedness, no headache, no lower extremity swelling, no palpitations or diaphoresis. Labs show sodium 137, potassium 3.5, chloride 102, bicarb 19, glucose 173, BUN 19, creatinine 1.03, calcium 9.5, alkaline phosphatase 56, albumin 5.0, lipase 24, AST 25, ALT 30, total protein 7.7, total bilirubin 1.9, white count 14.9, hemoglobin 14.6, hematocrit 42.2, MCV 81.2, RDW 13.2, platelet count 374 Urinalysis is sterile Abdominal ultrasound is negative.  No hepatobiliary abnormality identified. Twelve-lead EKG reviewed by me showed sinus bradycardia with biventricular hypertrophy.   ED Course: Patient is a 30 year old male who presents to the ER for evaluation of peri umbilical pain associated with nausea, vomiting and diarrhea. Patient has a WBC of 14.9 He received 2 doses of Zofran in the ER and a dose of promethazine but continues to have nausea and difficulty tolerating oral intake. He will be referred to observation status for further evaluation.  Review of Systems: As per HPI otherwise all other systems reviewed and negative.    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 doxycycline    Past Surgical History:  Procedure Laterality Date   CYST EXCISION  2011/2012   INGUINAL HERNIA REPAIR Left 09/24/2012   Procedure: HERNIA REPAIR INGUINAL ADULT;  Surgeon: Joyice Faster. Cornett, MD;  Location: [facility];  Service: General;  Laterality: Left;   INSERTION OF MESH Left 09/24/2012   Procedure: INSERTION OF MESH;  Surgeon: Joyice Faster. Cornett, MD;  Location: [facility];  Service: General;  Laterality: Left;     reports that he quit smoking about 7 years ago. His smoking use included cigarettes. He has a 0.75 pack-year smoking history. He has never used smokeless tobacco. He reports current alcohol use of about 2.0 standard drinks per week. He reports that he does not use drugs.  Allergies  Allergen Reactions   Ibuprofen Other (See Comments)    Makes GERD worse    Family History  Problem Relation Age of Onset   Healthy Mother    Healthy Father       Prior to Admission medications   Medication Sig Start Date End Date Taking? Authorizing Provider  lidocaine (XYLOCAINE) 2 % solution Use as directed 15 mLs in the mouth or throat every 4 (four) hours as needed for mouth pain. 03/29/21  Yes Blake Divine, MD  omeprazole (PRILOSEC OTC) 20 MG tablet Take 1 tablet (20 mg total) by mouth daily. 03/29/21 03/29/22 Yes Blake Divine, MD  ondansetron (ZOFRAN ODT) 4 MG disintegrating tablet Take 1 tablet (4 mg total) by mouth every 8 (eight) hours as needed for  nausea or vomiting. 03/29/21  Yes Blake Divine, MD  acetaminophen (TYLENOL) 325 MG tablet Take 2 tablets (650 mg total) by mouth every 6 (six) hours as needed. 10/19/20   Chase Picket, MD    Physical Exam: Vitals:   03/29/21 0725 03/29/21 0727 03/29/21 1012 03/29/21 1335  BP:  (!) 149/68 127/78 128/80  Pulse:  99 (!) 56 62  Resp:  '20 14 16  '$ Temp:  98.3 F (36.8 C)    TempSrc:  Oral    SpO2:  98%  98% 98%  Weight: 70 kg     Height: '5\' 11"'$  (1.803 m)        Vitals:   03/29/21 0725 03/29/21 0727 03/29/21 1012 03/29/21 1335  BP:  (!) 149/68 127/78 128/80  Pulse:  99 (!) 56 62  Resp:  '20 14 16  '$ Temp:  98.3 F (36.8 C)    TempSrc:  Oral    SpO2:  98% 98% 98%  Weight: 70 kg     Height: '5\' 11"'$  (1.803 m)         Constitutional: Alert and oriented x 3 . Not in any apparent distress HEENT:      Head: Normocephalic and atraumatic.         Eyes: PERLA, EOMI, Conjunctivae are normal. Sclera is non-icteric.       Mouth/Throat: Mucous membranes are moist.       Neck: Supple with no signs of meningismus. Cardiovascular: Tachycardia. No murmurs, gallops, or rubs. 2+ symmetrical distal pulses are present . No JVD. No LE edema Respiratory: Respiratory effort normal .Lungs sounds clear bilaterally. No wheezes, crackles, or rhonchi.  Gastrointestinal: Soft, peri umbilical tenderness, and non distended with positive bowel sounds.  Genitourinary: No CVA tenderness. Musculoskeletal: Nontender with normal range of motion in all extremities. No cyanosis, or erythema of extremities. Neurologic:  Face is symmetric. Moving all extremities. No gross focal neurologic deficits . Skin: Skin is warm, dry.  No rash or ulcers Psychiatric: Mood and affect are normal    Labs on Admission: I have personally reviewed following labs and imaging studies  CBC: Recent Labs  Lab 03/29/21 0728  WBC 14.9*  HGB 14.6  HCT 42.2  MCV 81.2  PLT [id number]   Basic Metabolic Panel: Recent Labs  Lab 03/29/21 0728  NA 137  K 3.5  CL 102  CO2 19*  GLUCOSE 173*  BUN 19  CREATININE 1.03  CALCIUM 9.5   GFR: Estimated Creatinine Clearance: 103.8 mL/min (by C-G formula based on SCr of 1.03 mg/dL). Liver Function Tests: Recent Labs  Lab 03/29/21 0728  AST 25  ALT 30  ALKPHOS 56  BILITOT 1.9*  PROT 7.7  ALBUMIN 5.0   Recent Labs  Lab 03/29/21 0728  LIPASE 24   No results for input(s): AMMONIA in the  last 168 hours. Coagulation Profile: No results for input(s): INR, PROTIME in the last 168 hours. Cardiac Enzymes: No results for input(s): CKTOTAL, CKMB, CKMBINDEX, TROPONINI in the last 168 hours. BNP (last 3 results) No results for input(s): PROBNP in the last 8760 hours. HbA1C: No results for input(s): HGBA1C in the last 72 hours. CBG: No results for input(s): GLUCAP in the last 168 hours. Lipid Profile: No results for input(s): CHOL, HDL, LDLCALC, TRIG, CHOLHDL, LDLDIRECT in the last 72 hours. Thyroid Function Tests: No results for input(s): TSH, T4TOTAL, FREET4, T3FREE, THYROIDAB in the last 72 hours. Anemia Panel: No results for input(s): VITAMINB12, FOLATE, FERRITIN, TIBC, IRON, RETICCTPCT in the last  72 hours. Urine analysis:    Component Value Date/Time   COLORURINE YELLOW (A) 03/29/2021 1429   APPEARANCEUR CLEAR (A) 03/29/2021 1429   LABSPEC 1.033 (H) 03/29/2021 1429   PHURINE 6.0 03/29/2021 1429   GLUCOSEU NEGATIVE 03/29/2021 1429   HGBUR NEGATIVE 03/29/2021 1429   BILIRUBINUR NEGATIVE 03/29/2021 1429   KETONESUR 80 (A) 03/29/2021 1429   PROTEINUR 30 (A) 03/29/2021 1429   NITRITE NEGATIVE 03/29/2021 1429   LEUKOCYTESUR NEGATIVE 03/29/2021 1429    Radiological Exams on Admission: US Abdomen Limited RUQ (LIVER/GB)  Result Date: 03/29/2021 CLINICAL DATA:  Epigastric pain for 2 days. EXAM: ULTRASOUND ABDOMEN LIMITED RIGHT UPPER QUADRANT COMPARISON:  None. FINDINGS: Gallbladder: No gallstones or wall thickening visualized. No sonographic Murphy sign noted by sonographer. Common bile duct: Diameter: 3 mm, within normal limits. Liver: No focal lesion identified. Within normal limits in parenchymal echogenicity. Portal vein is patent on color Doppler imaging with normal direction of blood flow towards the liver. Other: None. IMPRESSION: Negative. No hepatobiliary abnormality identified. Electronically Signed   By: Marlaine Hind M.D.   On: 03/29/2021 13:55      Assessment/Plan Active Problems:   Acute gastroenteritis   Marijuana use      Acute gastroenteritis Rule out appendicitis Will obtain CT scan of abdomen and pelvis without contrast to rule out acute appendicitis Keep patient n.p.o. for now Supportive care with IV fluid hydration, pain medication, IV PPI Send stool for C. difficile toxin as well as stool PCR    History of marijuana use ??  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  All questions and concerns have been addressed.  He verbalizes understanding and agrees with the plan. Disposition Plan: Back to previous home environment Consults called: none  Status: Observation    Rudolf Blizard MD [facility]     03/29/2021, 3:31 PM

## 2021-03-29 NOTE — ED Triage Notes (Signed)
Pt comes via [facility] with c/o vomiting since yesterday. Pt states dark brown. Pt states belly pain 8/10. Pt has hx of GERD.  Pt states this started after eating spicy food.  BP-154/60 VSS

## 2021-03-30 DIAGNOSIS — K529 Noninfective gastroenteritis and colitis, unspecified: Secondary | ICD-10-CM | POA: Diagnosis not present

## 2021-03-30 LAB — CBC
HCT: 37.7 % — ABNORMAL LOW (ref 39.0–52.0)
Hemoglobin: 12.8 g/dL — ABNORMAL LOW (ref 13.0–17.0)
MCH: 28.4 pg (ref 26.0–34.0)
MCHC: 34 g/dL (ref 30.0–36.0)
MCV: 83.8 fL (ref 80.0–100.0)
Platelets: 241 10*3/uL (ref 150–400)
RBC: 4.5 MIL/uL (ref 4.22–5.81)
RDW: 13.2 % (ref 11.5–15.5)
WBC: 8.3 10*3/uL (ref 4.0–10.5)
nRBC: 0 % (ref 0.0–0.2)

## 2021-03-30 LAB — BASIC METABOLIC PANEL
Anion gap: 5 (ref 5–15)
BUN: 16 mg/dL (ref 6–20)
CO2: 26 mmol/L (ref 22–32)
Calcium: 8.3 mg/dL — ABNORMAL LOW (ref 8.9–10.3)
Chloride: 105 mmol/L (ref 98–111)
Creatinine, Ser: 0.93 mg/dL (ref 0.61–1.24)
GFR, Estimated: >60 mL/min (ref >60)
Glucose, Bld: 103 mg/dL — ABNORMAL HIGH (ref 70–99)
Potassium: 3.5 mmol/L (ref 3.5–5.1)
Sodium: 136 mmol/L (ref 135–145)

## 2021-03-30 LAB — HIV ANTIBODY (ROUTINE TESTING W REFLEX): HIV Screen 4th Generation wRfx: NONREACTIVE

## 2021-03-30 MED ORDER — ONDANSETRON 4 MG PO TBDP: 4.0000 mg | ORAL_TABLET | Freq: Three times a day (TID) | ORAL | 0 refills | Status: DC | PRN

## 2021-03-30 MED ORDER — OMEPRAZOLE MAGNESIUM 20 MG PO TBEC: 20.0000 mg | DELAYED_RELEASE_TABLET | Freq: Every day | ORAL | 1 refills | Status: AC

## 2021-03-30 NOTE — Discharge Summary (Signed)
[name] [id number] DOB: [birth year] DOA: 03/29/2021  PCP: Baruch Goldmann, PA-C  Admit date: 03/29/2021 Discharge date: 03/30/2021  Time spent: 35 minutes  Recommendations for Outpatient Follow-up:  Pcp f/u Gi f/u if ppi doesn't adequately control gerd symptoms     Discharge Diagnoses:  Active Problems:   Acute gastroenteritis   Marijuana use   Discharge Condition: stable  Diet recommendation: regular  Filed Weights   03/29/21 0725 03/29/21 2027  Weight: 70 kg 69 kg    History of present illness:  [name] is a 30 y.o. male with medical history significant for GERD, marijuana use who presents to the emergency room for evaluation of nausea and vomiting for over 24 hours associated with periumbilical pain which he rated 8 x 10 in intensity at its worst.  Pain is nonradiating.  He also has loose watery stools and denies having any sick contacts.  He denies any recent antibiotic use. Patient states that his symptoms started after he ate some spicy food. He has had chills but denies having any fever, no urinary symptoms, no chest pain, no shortness of breath, no dizziness, no lightheadedness, no headache, no lower extremity swelling, no palpitations or diaphoresis.  Hospital Course:  Patient presented with one day abdominal pain and nausea/vomiting/diarrhea. Labs unremarkable save for mildly elevated t bili. CT of abdomen and u/s of abdomen both unremarkable. Patient also with uncontrolled gerd that likely contributes. Started on zofran and symptoms improved remarkably. Abdomen soft and non-tender, tolerated breakfast w/o vomiting or diarrhea. Will discharge home with ppi and zofran. Consider gi f/u if gerd symptoms not adequately controlled w/ ppi.   Procedures: none   Consultations: none  Discharge Exam: Vitals:   03/30/21 0410 03/30/21 0816  BP: 119/66 120/76  Pulse: (!) 50 (!) 58  Resp: 20 18  Temp: 99 F (37.2 C) 98.1 F (36.7 C)  SpO2: 100% 98%     General: NAD Cardiovascular: RRR Respiratory: CTAB Abdomen: soft, non-tender, normal bowel sounds  Discharge Instructions   Discharge Instructions     Diet - low sodium heart healthy   Complete by: As directed    Increase activity slowly   Complete by: As directed       Allergies as of 03/30/2021       Reactions   Ibuprofen Other (See Comments)   Makes GERD worse        Medication List     STOP taking these medications    famotidine 20 MG tablet Commonly known as: PEPCID       TAKE these medications    acetaminophen 325 MG tablet Commonly known as: Tylenol Take 2 tablets (650 mg total) by mouth every 6 (six) hours as needed.   omeprazole 20 MG tablet Commonly known as: PriLOSEC OTC Take 1 tablet (20 mg total) by mouth daily.   ondansetron 4 MG disintegrating tablet Commonly known as: Zofran ODT Take 1 tablet (4 mg total) by mouth every 8 (eight) hours as needed for nausea or vomiting.       Allergies  Allergen Reactions   Ibuprofen Other (See Comments)    Makes GERD worse    Follow-up Information     [facility] .   Specialty: Emergency Medicine Why: If symptoms worsen Contact information: [place] [id number] [facility] [place] [phone]        Baruch Goldmann, [place]. Schedule an appointment as soon as possible for a visit .   Specialty:  Physician Assistant Contact information: [place] [address] [id number]         Jonathon Bellows, MD Follow up.   Specialty: Gastroenterology Why: consider follow-up here if omeprazole does not adequately treat your acid reflux Contact information: [address] [place] [address] [phone]                  The results of significant diagnostics from this hospitalization (including imaging, microbiology, ancillary and laboratory) are listed below for  reference.    Significant Diagnostic Studies: CT ABDOMEN PELVIS WO CONTRAST  Result Date: 03/29/2021 CLINICAL DATA:  Suspected appendicitis.  High risk. EXAM: CT ABDOMEN AND PELVIS WITHOUT CONTRAST TECHNIQUE: Multidetector CT imaging of the abdomen and pelvis was performed following the standard protocol without IV contrast. COMPARISON:  Ultrasound 03/29/2021 FINDINGS: Lower chest: Lung bases are clear. Hepatobiliary: No focal liver abnormality is seen. No gallstones, gallbladder wall thickening, or biliary dilatation. Pancreas: Unremarkable. No pancreatic ductal dilatation or surrounding inflammatory changes. Spleen: Normal in size without focal abnormality. Adrenals/Urinary Tract: Adrenal glands are unremarkable. Kidneys are normal, without renal calculi, focal lesion, or hydronephrosis. Bladder is unremarkable. Stomach/Bowel: The stomach, small bowel, and colon are not abnormally distended. No wall thickening or inflammatory changes are appreciated. Contrast material flows through to the colon without evidence of obstruction. The appendix is normal. No inflammatory changes in the right lower quadrant. Vascular/Lymphatic: Scattered lymph nodes in the mesentery are not pathologically enlarged. Normal caliber abdominal aorta. Reproductive: Prostate is unremarkable. Other: No free air or free fluid in the abdomen. Abdominal wall musculature appears intact. Musculoskeletal: No acute or significant osseous findings. IMPRESSION: No acute abnormalities demonstrated in the abdomen or pelvis. Normal appendix. Electronically Signed   By: Lucienne Capers M.D.   On: 03/29/2021 17:19   US Abdomen Limited RUQ (LIVER/GB)  Result Date: 03/29/2021 CLINICAL DATA:  Epigastric pain for 2 days. EXAM: ULTRASOUND ABDOMEN LIMITED RIGHT UPPER QUADRANT COMPARISON:  None. FINDINGS: Gallbladder: No gallstones or wall thickening visualized. No sonographic Murphy sign noted by sonographer. Common bile duct: Diameter: 3 mm, within  normal limits. Liver: No focal lesion identified. Within normal limits in parenchymal echogenicity. Portal vein is patent on color Doppler imaging with normal direction of blood flow towards the liver. Other: None. IMPRESSION: Negative. No hepatobiliary abnormality identified. Electronically Signed   By: Marlaine Hind M.D.   On: 03/29/2021 13:55    Microbiology: Recent Results (from the past 240 hour(s))  Resp Panel by RT-PCR (Flu A&B, Covid) Nasopharyngeal Swab     Status: None   Collection Time: 03/29/21  2:46 PM   Specimen: Nasopharyngeal Swab; Nasopharyngeal(NP) swabs in vial transport medium  Result Value Ref Range Status   SARS Coronavirus 2 by RT PCR NEGATIVE NEGATIVE Final    Comment: (NOTE) SARS-CoV-2 target nucleic acids are NOT DETECTED.  The SARS-CoV-2 RNA is generally detectable in upper respiratory specimens during the acute phase of infection. The lowest concentration of SARS-CoV-2 viral copies this assay can detect is 138 copies/mL. A negative result does not preclude SARS-Cov-2 infection and should not be used as the sole basis for treatment or other patient management decisions. A negative result may occur with  improper specimen collection/handling, submission of specimen other than nasopharyngeal swab, presence of viral mutation(s) within the areas targeted by this assay, and inadequate number of viral copies(<138 copies/mL). A negative result must be combined with clinical observations, patient history, and epidemiological information. The expected result is Negative.  Fact Sheet for Patients:  EntrepreneurPulse.com.au  Fact Sheet for Healthcare Providers:  IncredibleEmployment.be  This test is no t yet approved or cleared by the [place] FDA and  has been authorized for detection and/or diagnosis of SARS-CoV-2 by FDA under an Emergency Use Authorization (EUA). This EUA will remain  in effect (meaning this test can be used)  for the duration of the COVID-19 declaration under Section 564(b)(1) of the Act, 21 U.S.C.section 360bbb-3(b)(1), unless the authorization is terminated  or revoked sooner.       Influenza A by PCR NEGATIVE NEGATIVE Final   Influenza B by PCR NEGATIVE NEGATIVE Final    Comment: (NOTE) The Xpert Xpress SARS-CoV-2/FLU/RSV plus assay is intended as an aid in the diagnosis of influenza from Nasopharyngeal swab specimens and should not be used as a sole basis for treatment. Nasal washings and aspirates are unacceptable for Xpert Xpress SARS-CoV-2/FLU/RSV testing.  Fact Sheet for Patients: EntrepreneurPulse.com.au  Fact Sheet for Healthcare Providers: IncredibleEmployment.be  This test is not yet approved or cleared by the [place] FDA and has been authorized for detection and/or diagnosis of SARS-CoV-2 by FDA under an Emergency Use Authorization (EUA). This EUA will remain in effect (meaning this test can be used) for the duration of the COVID-19 declaration under Section 564(b)(1) of the Act, 21 U.S.C. section 360bbb-3(b)(1), unless the authorization is terminated or revoked.  Performed at [facility], [address] [place], [place] [address]      Labs: Basic Metabolic Panel: Recent Labs  Lab 03/29/21 0728 03/30/21 0410  NA 137 136  K 3.5 3.5  CL 102 105  CO2 19* 26  GLUCOSE 173* 103*  BUN 19 16  CREATININE 1.03 0.93  CALCIUM 9.5 8.3*   Liver Function Tests: Recent Labs  Lab 03/29/21 0728  AST 25  ALT 30  ALKPHOS 56  BILITOT 1.9*  PROT 7.7  ALBUMIN 5.0   Recent Labs  Lab 03/29/21 0728  LIPASE 24   No results for input(s): AMMONIA in the last 168 hours. CBC: Recent Labs  Lab 03/29/21 0728 03/30/21 0410  WBC 14.9* 8.3  HGB 14.6 12.8*  HCT 42.2 37.7*  MCV 81.2 83.8  PLT 374 241   Cardiac Enzymes: No results for input(s): CKTOTAL, CKMB, CKMBINDEX, TROPONINI in the last 168 hours. BNP: BNP  (last 3 results) No results for input(s): BNP in the last 8760 hours.  ProBNP (last 3 results) No results for input(s): PROBNP in the last 8760 hours.  CBG: No results for input(s): GLUCAP in the last 168 hours.     Signed:  Desma Maxim MD.  [facility] 03/30/2021, 10:37 AM

## 2021-03-30 NOTE — Progress Notes (Signed)
Pt provided discharge instructions. Pt denies N/V/D at this time. VSS. Pt discharged ambulating with a steady gait.   03/30/21 0816  Vitals  Temp 98.1 F (36.7 C)  BP 120/76  MAP (mmHg) 88  BP Location Right Arm  BP Method Automatic  Patient Position (if appropriate) Lying  Pulse Rate (!) 58  Pulse Rate Source Monitor  Resp 18  MEWS COLOR  MEWS Score Color Green  Oxygen Therapy  SpO2 98 %

## 2021-11-10 ENCOUNTER — Emergency Department
Admission: EM | Admit: 2021-11-10 | Discharge: 2021-11-10 | Disposition: A | Discharge status: Home / Self Care | Payer: BC Managed Care – PPO | Attending: Emergency Medicine | Admitting: Emergency Medicine

## 2021-11-10 ENCOUNTER — Encounter: Payer: Self-pay | Admitting: Intensive Care

## 2021-11-10 ENCOUNTER — Emergency Department: Payer: BC Managed Care – PPO

## 2021-11-10 ENCOUNTER — Other Ambulatory Visit: Payer: Self-pay

## 2021-11-10 DIAGNOSIS — D72829 Elevated white blood cell count, unspecified: Secondary | ICD-10-CM | POA: Insufficient documentation

## 2021-11-10 DIAGNOSIS — R1110 Vomiting, unspecified: Secondary | ICD-10-CM | POA: Diagnosis present

## 2021-11-10 DIAGNOSIS — R739 Hyperglycemia, unspecified: Secondary | ICD-10-CM | POA: Insufficient documentation

## 2021-11-10 DIAGNOSIS — R6883 Chills (without fever): Secondary | ICD-10-CM | POA: Insufficient documentation

## 2021-11-10 DIAGNOSIS — R109 Unspecified abdominal pain: Secondary | ICD-10-CM | POA: Insufficient documentation

## 2021-11-10 LAB — CBC
HCT: 44.3 % (ref 39.0–52.0)
Hemoglobin: 14.7 g/dL (ref 13.0–17.0)
MCH: 27.5 pg (ref 26.0–34.0)
MCHC: 33.2 g/dL (ref 30.0–36.0)
MCV: 83 fL (ref 80.0–100.0)
Platelets: 379 10*3/uL (ref 150–400)
RBC: 5.34 MIL/uL (ref 4.22–5.81)
RDW: 13.2 % (ref 11.5–15.5)
WBC: 13.7 10*3/uL — ABNORMAL HIGH (ref 4.0–10.5)
nRBC: 0 % (ref 0.0–0.2)

## 2021-11-10 LAB — COMPREHENSIVE METABOLIC PANEL
ALT: 20 U/L (ref 0–44)
AST: 22 U/L (ref 15–41)
Albumin: 4.9 g/dL (ref 3.5–5.0)
Alkaline Phosphatase: 64 U/L (ref 38–126)
Anion gap: 13 (ref 5–15)
BUN: 16 mg/dL (ref 6–20)
CO2: 23 mmol/L (ref 22–32)
Calcium: 9.5 mg/dL (ref 8.9–10.3)
Chloride: 103 mmol/L (ref 98–111)
Creatinine, Ser: 0.89 mg/dL (ref 0.61–1.24)
GFR, Estimated: >60 mL/min (ref >60)
Glucose, Bld: 173 mg/dL — ABNORMAL HIGH (ref 70–99)
Potassium: 3.9 mmol/L (ref 3.5–5.1)
Sodium: 139 mmol/L (ref 135–145)
Total Bilirubin: 1.5 mg/dL — ABNORMAL HIGH (ref 0.3–1.2)
Total Protein: 8 g/dL (ref 6.5–8.1)

## 2021-11-10 LAB — LIPASE, BLOOD: Lipase: 26 U/L (ref 11–51)

## 2021-11-10 MED ORDER — CAPSAICIN 0.025 % EX CREA
TOPICAL_CREAM | Freq: Once | CUTANEOUS | Status: AC
  Filled 2021-11-10: qty 60

## 2021-11-10 MED ORDER — ONDANSETRON HCL 4 MG/2ML IJ SOLN
4.0000 mg | Freq: Once | INTRAMUSCULAR | Status: AC
  Administered 2021-11-10: 4 mg via INTRAVENOUS
  Filled 2021-11-10: qty 2

## 2021-11-10 MED ORDER — LACTATED RINGERS IV BOLUS
1000.0000 mL | Freq: Once | INTRAVENOUS | Status: AC
  Administered 2021-11-10: 1000 mL via INTRAVENOUS

## 2021-11-10 MED ORDER — HALOPERIDOL LACTATE 5 MG/ML IJ SOLN
2.5000 mg | Freq: Once | INTRAMUSCULAR | Status: AC
  Administered 2021-11-10: 2.5 mg via INTRAVENOUS
  Filled 2021-11-10: qty 1

## 2021-11-10 MED ORDER — IOHEXOL 300 MG/ML  SOLN
80.0000 mL | Freq: Once | INTRAMUSCULAR | Status: AC | PRN
  Administered 2021-11-10: 80 mL via INTRAVENOUS
  Filled 2021-11-10: qty 80

## 2021-11-10 MED ORDER — ONDANSETRON 4 MG PO TBDP: 4.0000 mg | ORAL_TABLET | Freq: Three times a day (TID) | ORAL | 0 refills | Status: DC | PRN

## 2021-11-10 NOTE — ED Notes (Signed)
Pt discharge information reviewed. Pt understands need for follow up care and when to return if symptoms worsen. All questions answered. Pt is alert and oriented with even and regular respirations. Pt is seen ambulating out of department with string steady gait.   

## 2021-11-10 NOTE — ED Provider Notes (Signed)
? ?[facility] ?Provider Note ? ? ? Event Date/Time  ? First MD Initiated Contact with Patient 11/10/21 1417   ?  (approximate) ? ? ?History  ? ?Chief Complaint ?Emesis ? ? ?HPI ?[name] is a 31 y.o. male, history of cannabis use, gastroenteritis, presents to the emergency department for evaluation of emesis.  Patient states that he has been throwing up multiple times a day for about a month.  He states that he usually throws up approximately 2-3 times a day, however today he has thrown up 20 times.  In addition endorses chills and severe abdominal cramping.  He states that there is a small amount of blood mixed in with his vomit today as well.  He states that this is happened to him before, however he has not been diagnosed with anything after being evaluated by doctors in the past.  He has his first appointment scheduled with gastroenterology in a few weeks.  Denies chest pain, shortness of breath, back pain, urinary symptoms, diarrhea, hematochezia, leg swelling, numbness/tingling in upper or lower extremities, lightheadedness/dizziness, or weakness. ? ?Of note, patient states that he smokes marijuana 2-3 times a week, and has been a regular user for several years. ? ?History Limitations: No limitations. ? ?  ? ? ?Physical Exam  ?Triage Vital Signs: ?ED Triage Vitals  ?Enc Vitals Group  ?   BP 11/10/21 1407 (!) 158/86  ?   Pulse Rate 11/10/21 1407 63  ?   Resp 11/10/21 1407 16  ?   Temp 11/10/21 1407 98.3 ?F (36.8 ?C)  ?   Temp Source 11/10/21 1407 Oral  ?   SpO2 11/10/21 1407 99 %  ?   Weight 11/10/21 1408 160 lb (72.6 kg)  ?   Height 11/10/21 1408 '5\' 11"'$  (1.803 m)  ?   Head Circumference --   ?   Peak Flow --   ?   Pain Score 11/10/21 1408 7  ?   Pain Loc --   ?   Pain Edu? --   ?   Excl. in [place]? --   ? ? ?Most recent vital signs: ?Vitals:  ? 11/10/21 1407 11/10/21 1834  ?BP: (!) 158/86 (!) 148/78  ?Pulse: 63 70  ?Resp: 16 18  ?Temp: 98.3 ?F (36.8 ?C)   ?SpO2: 99% 97%   ? ? ?General: Awake, appears significantly uncomfortable. ?Skin: Pale, diaphoretic ?CV: Good peripheral perfusion.  ?Resp: Normal effort.  ?Abd: Soft, non-tender. No distention.  ?Neuro: At baseline. No gross neurological deficits.  ?Other: Observed the vomit inside his emesis bag, which appears to be green/brownish in color.  No bright red blood appreciated. ? ?Physical Exam ? ? ? ?ED Results / Procedures / Treatments  ?Labs ?(all labs ordered are listed, but only abnormal results are displayed) ?Labs Reviewed  ?COMPREHENSIVE METABOLIC PANEL - Abnormal; Notable for the following components:  ?    Result Value  ? Glucose, Bld 173 (*)   ? Total Bilirubin 1.5 (*)   ? All other components within normal limits  ?CBC - Abnormal; Notable for the following components:  ? WBC 13.7 (*)   ? All other components within normal limits  ?LIPASE, BLOOD  ? ? ? ?EKG ?Sinus rhythm with arrhythmia, rate of 71, no clinically significant ST segment changes, right axis deviation present, no AV blocks, normal QT interval, normal QRS interval. ? ? ?RADIOLOGY ? ?ED Provider Interpretation: I personally reviewed and interpreted the CT, no evidence of acute abnormalities based  on my interpretation. ? ?CT Abdomen Pelvis W Contrast ? ?Result Date: 11/10/2021 ?CLINICAL DATA:  Nausea and vomiting. EXAM: CT ABDOMEN AND PELVIS WITH CONTRAST TECHNIQUE: Multidetector CT imaging of the abdomen and pelvis was performed using the standard protocol following bolus administration of intravenous contrast. RADIATION DOSE REDUCTION: This exam was performed according to the departmental dose-optimization program which includes automated exposure control, adjustment of the mA and/or kV according to patient size and/or use of iterative reconstruction technique. CONTRAST:  39m OMNIPAQUE IOHEXOL 300 MG/ML  SOLN COMPARISON:  CT abdomen pelvis 03/29/2021 FINDINGS: Lower chest: No acute abnormality. Hepatobiliary: No focal liver abnormality is seen. No gallstones,  gallbladder wall thickening, or biliary dilatation. Pancreas: Unremarkable. No pancreatic ductal dilatation or surrounding inflammatory changes. Spleen: Normal in size without focal abnormality. Adrenals/Urinary Tract: Adrenal glands are unremarkable. Kidneys are normal, without renal calculi, focal lesion, or hydronephrosis. Bladder is unremarkable. Stomach/Bowel: Stomach is within normal limits. Appendix appears normal. No evidence of bowel wall thickening, distention, or inflammatory changes. Vascular/Lymphatic: No significant vascular findings are present. No enlarged abdominal or pelvic lymph nodes. Reproductive: Prostate is unremarkable. Other: No abdominal wall hernia or abnormality. No abdominopelvic ascites. Musculoskeletal: No acute or suspicious osseous findings. IMPRESSION: No acute abnormality in the abdomen or pelvis. Specifically, no findings of cholecystitis, pancreatitis, appendicitis, or bowel obstruction. Electronically Signed   By: LIleana RoupM.D.   On: 11/10/2021 16:14   ? ?PROCEDURES: ? ?Critical Care performed: None ? ?Procedures ? ? ? ?MEDICATIONS ORDERED IN ED: ?Medications  ?lactated ringers bolus 1,000 mL (0 mLs Intravenous Stopped 11/10/21 1915)  ?ondansetron ([facility] injection 4 mg (4 mg Intravenous Given 11/10/21 1508)  ?haloperidol lactate (HALDOL) injection 2.5 mg (2.5 mg Intravenous Given 11/10/21 1515)  ?capsaicin (ZOSTRIX) 0.025 % cream ( Topical Given 11/10/21 1518)  ?iohexol (OMNIPAQUE) 300 MG/ML solution 80 mL (80 mLs Intravenous Contrast Given 11/10/21 1551)  ? ? ? ?IMPRESSION / MDM / ASSESSMENT AND PLAN / ED COURSE  ?I reviewed the triage vital signs and the nursing notes. ?             ?               ? ? ?Differential diagnosis includes, but is not limited to, cannabinoid hyperemesis syndrome, pancreatitis, appendicitis, cholecystitis, cholelithiasis, cholangitis, nephrolithiasis, cystitis, pyelonephritis. ? ?ED Course ?Patient appears uncomfortable, but stable.  Vital signs are  within normal limits.  We will go ahead treat with IV fluids, ondansetron,  ? ?CBC notable for leukocytosis at 13.7.  No anemia present. ? ?CMP shows elevated glucose at 173, otherwise no evidence of kidney injury, transaminitis, or significant electrolyte abnormalities. ? ?Lipase unremarkable at 26.  Unlikely pancreatitis ? ?Assessment/Plan ?Currently pending abdominal/pelvic CT, however the patient's history and physical exam consistent with cannabinoid hyperemesis syndrome.  The reported blood in emesis likely due to esophageal wall breakdown secondary to excessive vomiting versus peptic/gastric ulcers, though does not appear to be severe.  He has been treated so far with IV fluids, ondansetron, Haldol, and capsaicin cream.  If the CT scan is negative, plan is to provide p.o. challenge to the patient.  If successful, will discharge the patient with gastroenterology follow-up.  Patient care transferred over to EValora Piccolo MD at approximately 1600. ? ?  ? ? ?FINAL CLINICAL IMPRESSION(S) / ED DIAGNOSES  ? ?Final diagnoses:  ?Hyperemesis  ? ? ? ?Rx / DC Orders  ? ?ED Discharge Orders   ? ?      Ordered  ?  ondansetron (ZOFRAN-ODT) 4 MG disintegrating tablet  Every 8 hours PRN       ? 11/10/21 2014  ? ?  ?  ? ?  ? ? ? ?Note:  This document was prepared using Dragon voice recognition software and may include unintentional dictation errors. ?  ?Teodoro Spray, [place] ?11/11/21 1587 ? ?  ?Naaman Plummer, MD ?11/11/21 1511 ? ?

## 2021-11-10 NOTE — ED Notes (Signed)
PO challenge successful. Pt able to tolerate liquids well. No episodes of vomiting.  ?

## 2021-11-10 NOTE — ED Triage Notes (Signed)
Patient reports he has been throwing up multiple times a day for about a month. Reports today the emesis was bloody and brown ?

## 2021-11-10 NOTE — ED Notes (Signed)
Pt given graham crackers and ginger ale for PO challenge  ?

## 2022-01-09 IMAGING — CT CT ABD-PELV W/O CM
2 of 4 series · 16 of 46 positions shown, 18 images · non-contrast
Comparison: Ultrasound 03/29/2021

CLINICAL DATA: Suspected appendicitis.  High risk.

EXAM:
CT ABDOMEN AND PELVIS WITHOUT CONTRAST
TECHNIQUE: Multidetector CT imaging of the abdomen and pelvis was performed
following the standard protocol without IV contrast.

[Series 2: axial st · axial · 0.75mm/px · z∈[-1076,-631]mm · 13 of 99 slices shown, 15 images]
[im 5/99  soft-tissue]
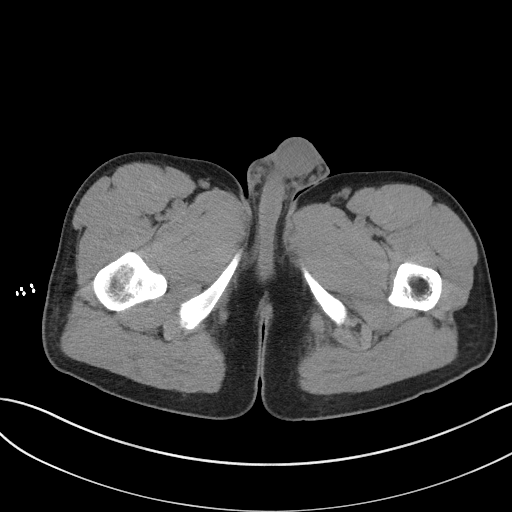
[im 5/99  bone]
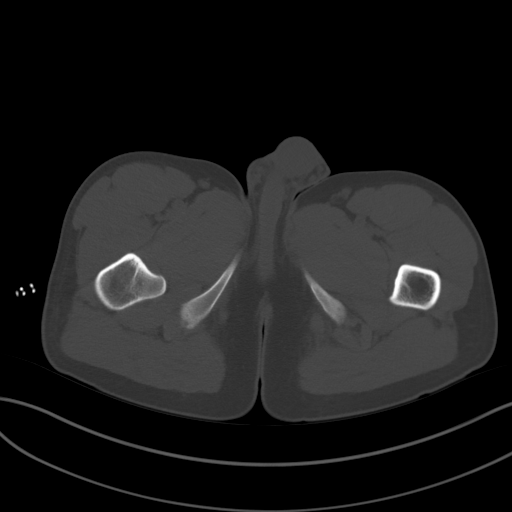
[im 13/99  soft-tissue]
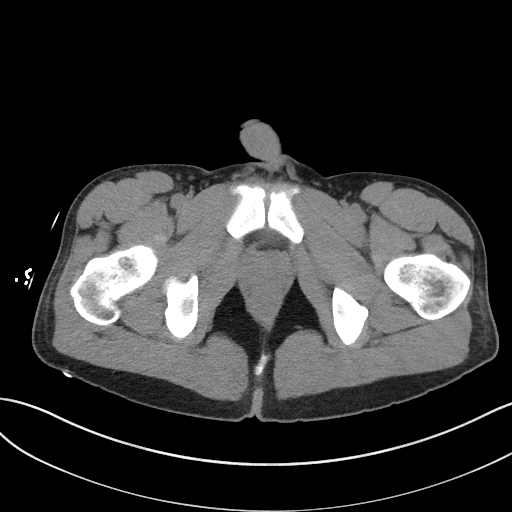
[im 21/99  soft-tissue]
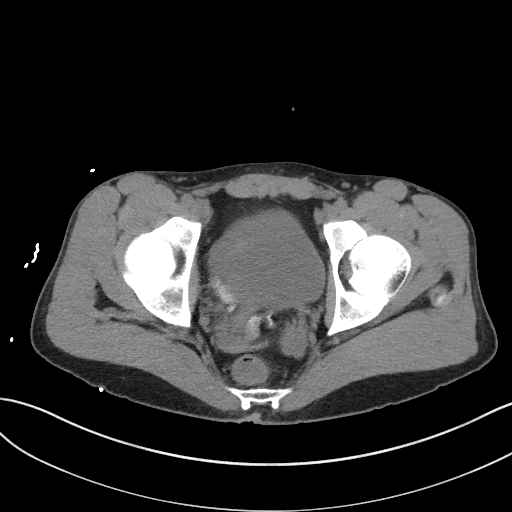
[im 29/99  soft-tissue]
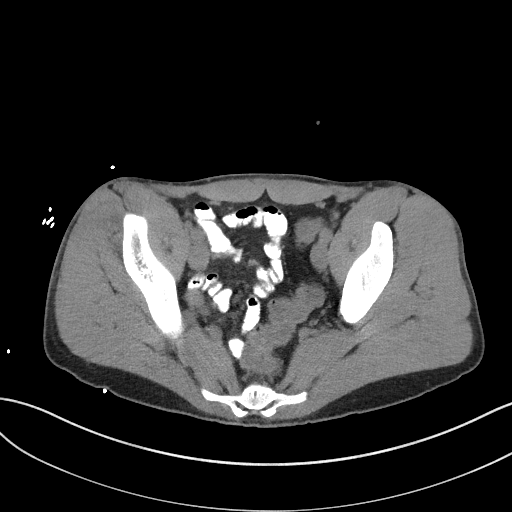
[im 33/99  soft-tissue]
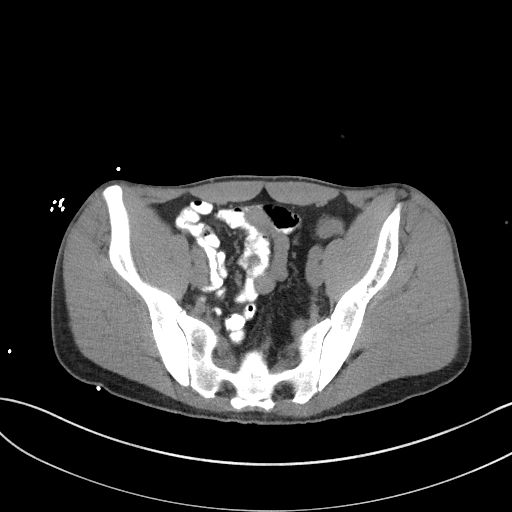
[im 41/99  soft-tissue]
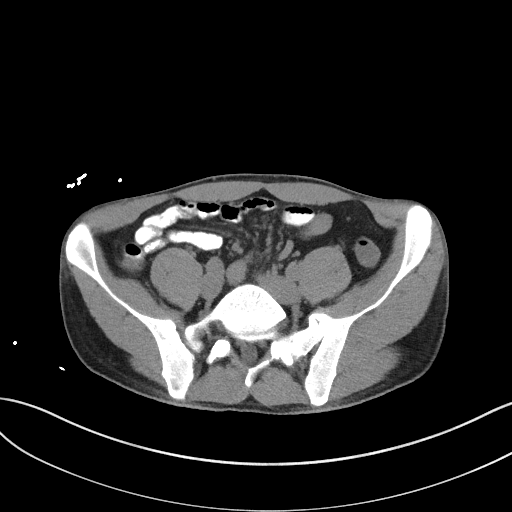
[im 50/99  soft-tissue]
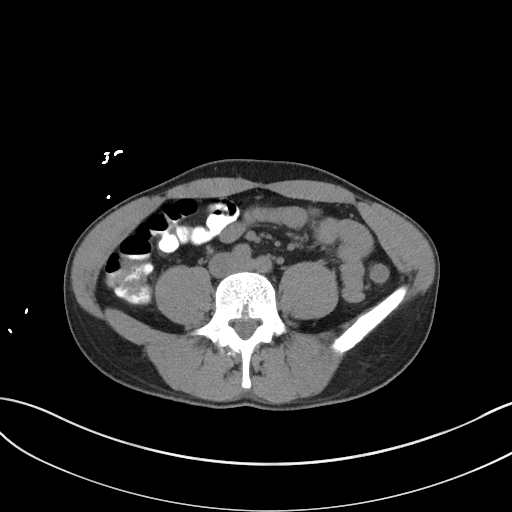
[im 58/99  soft-tissue]
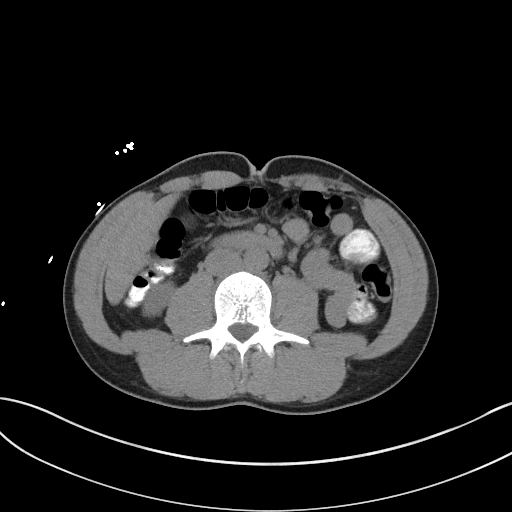
[im 66/99  soft-tissue]
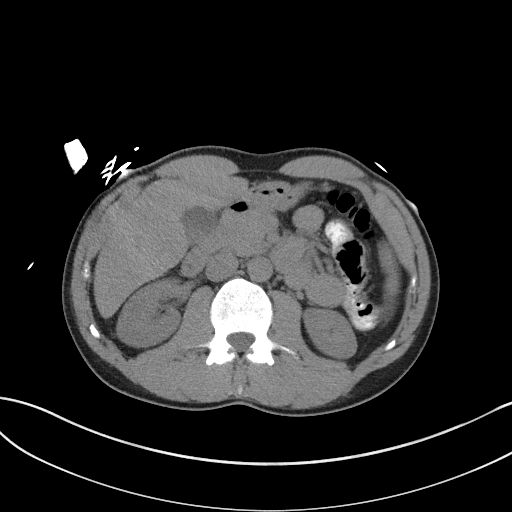
[im 66/99  bone]
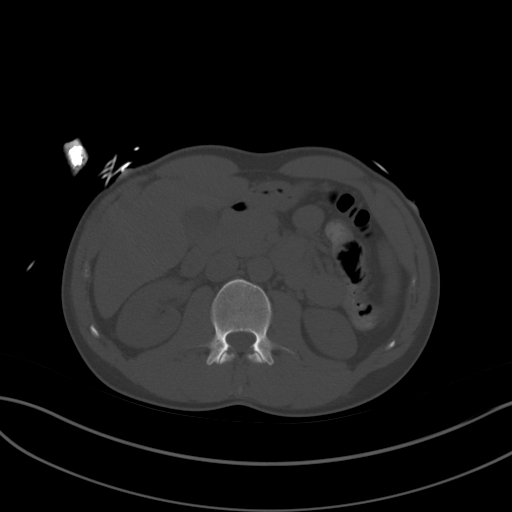
[im 70/99  soft-tissue]
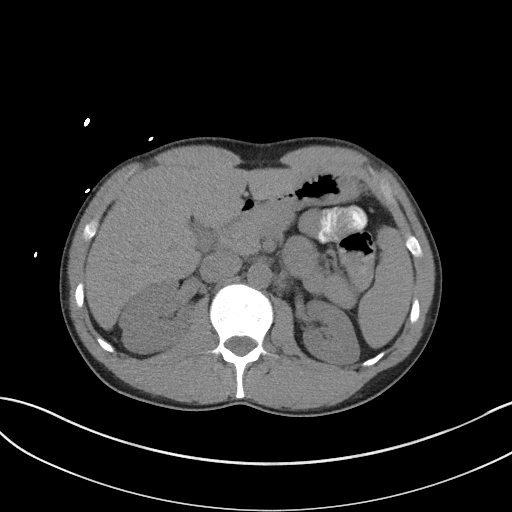
[im 78/99  soft-tissue]
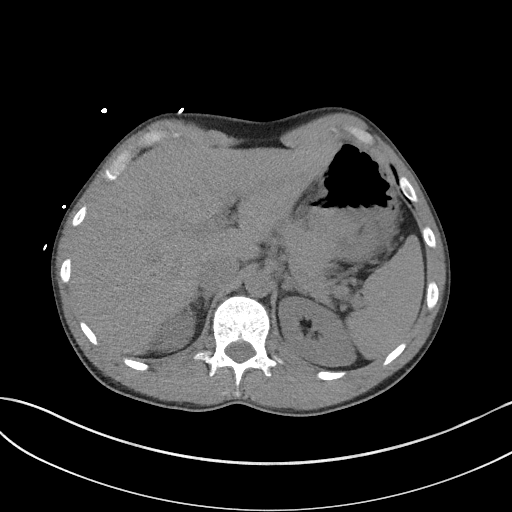
[im 86/99  soft-tissue]
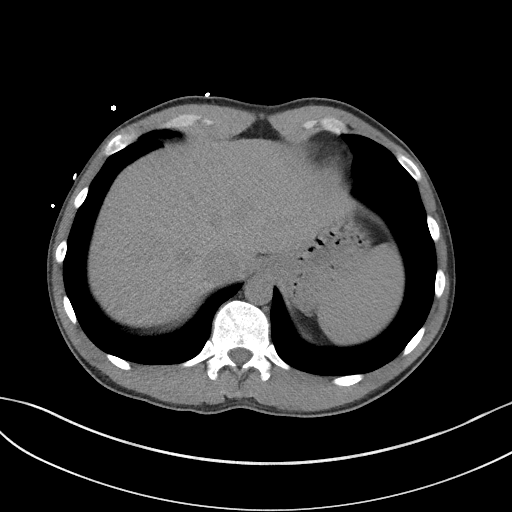
[im 94/99  soft-tissue]
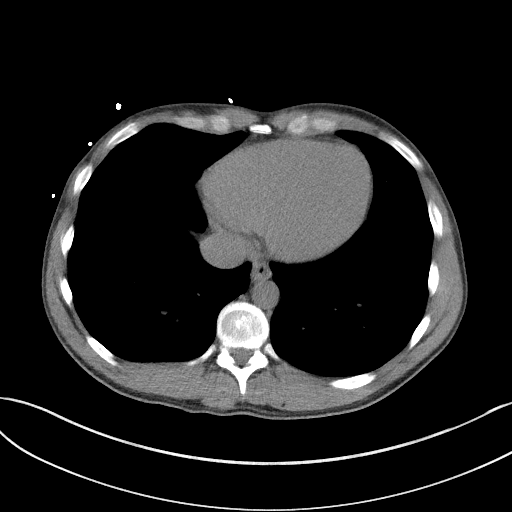

[Series 5: coronal st · coronal · 0.99mm/px · 3 of 101 slices shown]
[im 34/101  soft-tissue]
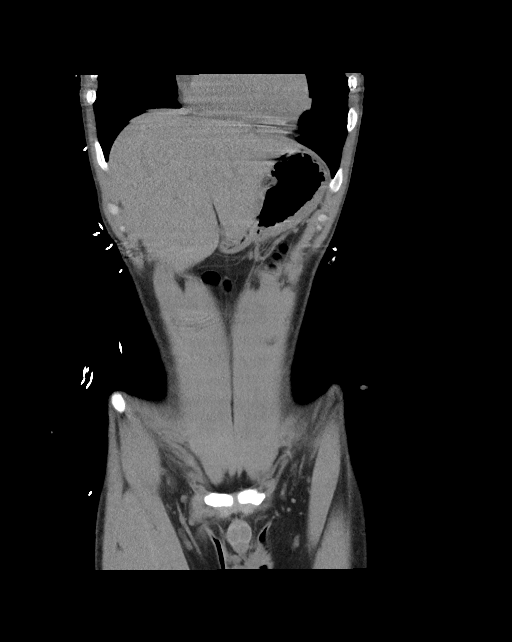
[im 45/101  soft-tissue]
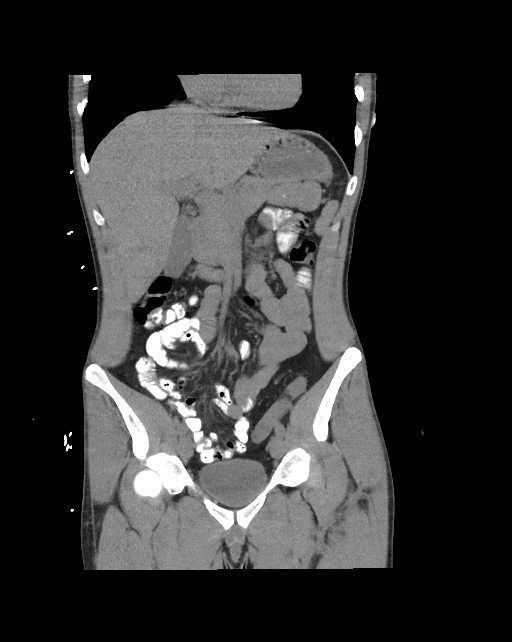
[im 56/101  soft-tissue]
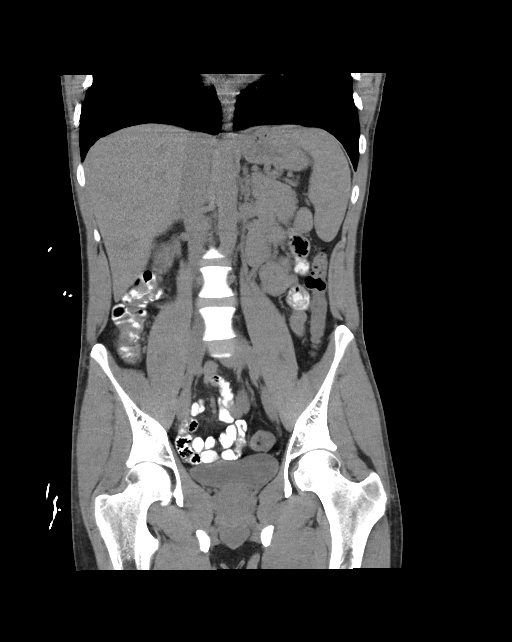

[16 of 46 positions shown; findings below may reference images not displayed]

FINDINGS: Lower chest: Lung bases are clear.

Hepatobiliary: No focal liver abnormality is seen. No gallstones,
gallbladder wall thickening, or biliary dilatation.

Pancreas: Unremarkable. No pancreatic ductal dilatation or
surrounding inflammatory changes.

Spleen: Normal in size without focal abnormality.

Adrenals/Urinary Tract: Adrenal glands are unremarkable. Kidneys are
normal, without renal calculi, focal lesion, or hydronephrosis.
Bladder is unremarkable.

Stomach/Bowel: The stomach, small bowel, and colon are not
abnormally distended. No wall thickening or inflammatory changes are
appreciated. Contrast material flows through to the colon without
evidence of obstruction. The appendix is normal. No inflammatory
changes in the right lower quadrant.

Vascular/Lymphatic: Scattered lymph nodes in the mesentery are not
pathologically enlarged. Normal caliber abdominal aorta.

Reproductive: Prostate is unremarkable.

Other: No free air or free fluid in the abdomen. Abdominal wall
musculature appears intact.

Musculoskeletal: No acute or significant osseous findings.
IMPRESSION: No acute abnormalities demonstrated in the abdomen or pelvis. Normal
appendix.

## 2022-01-09 IMAGING — US US ABDOMEN LIMITED
1 series · 14 of 25 positions shown · non-contrast
Comparison: None.

CLINICAL DATA: Epigastric pain for 2 days.

EXAM:
ULTRASOUND ABDOMEN LIMITED RIGHT UPPER QUADRANT

[Series 1: us abdomen limited ruq (liver/gb) · 14 of 83 slices shown]
[im 1/83]
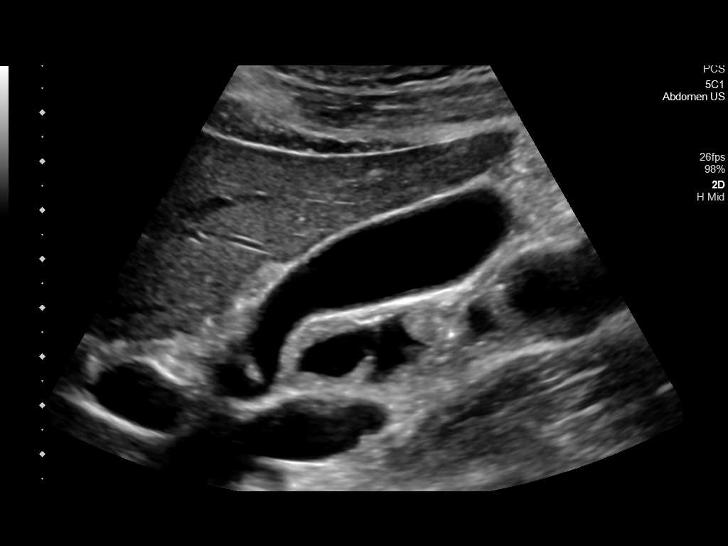
[im 7/83]
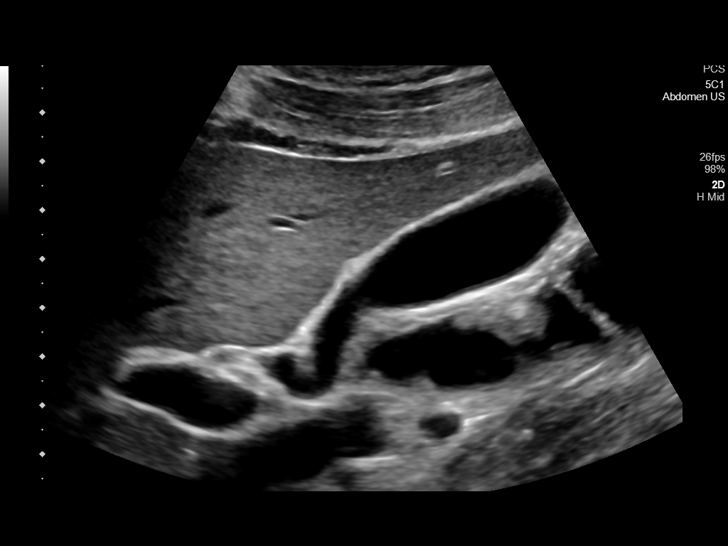
[im 14/83]
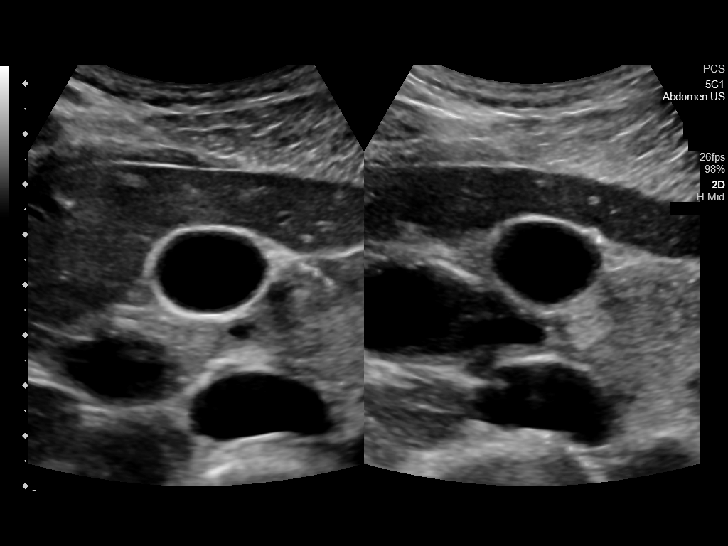
[im 21/83]
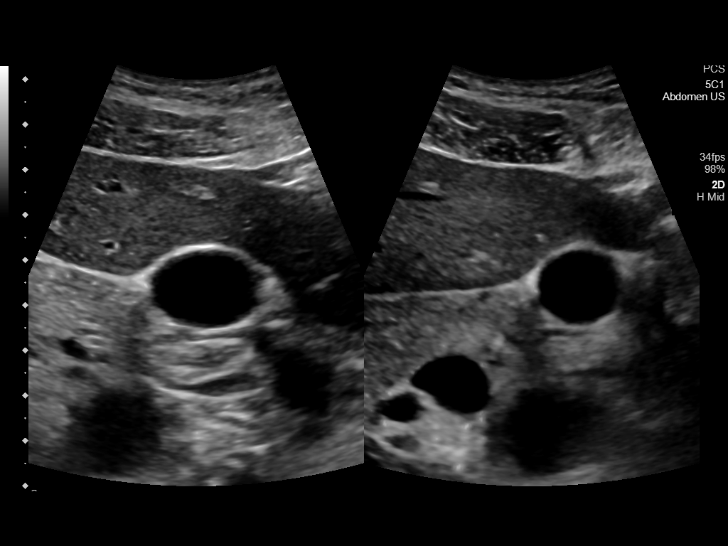
[im 28/83]
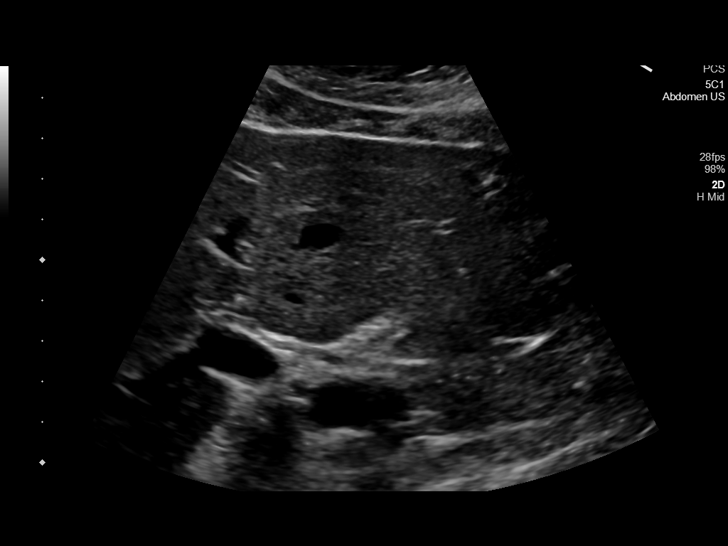
[im 31/83]
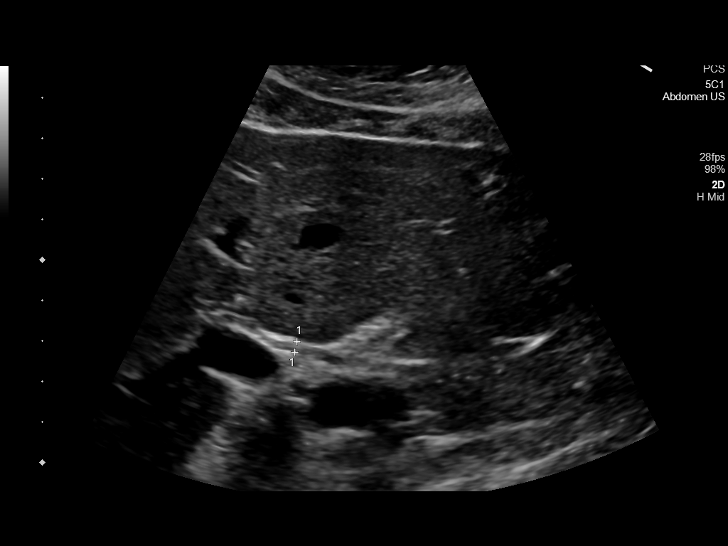
[im 38/83]
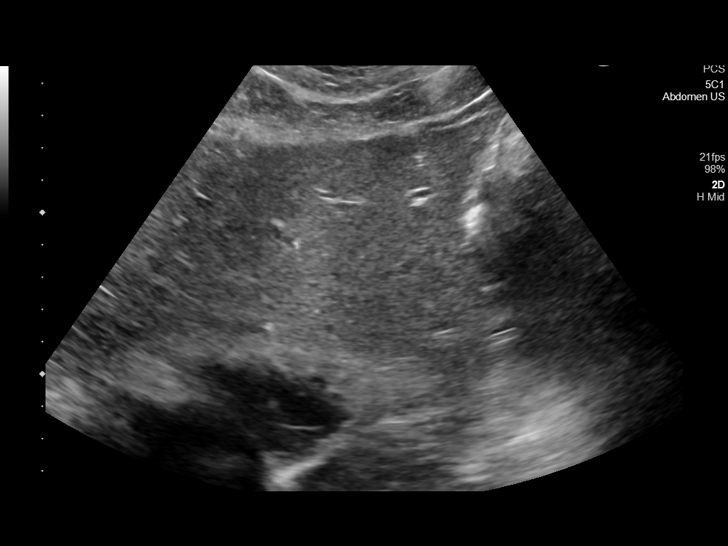
[im 45/83]
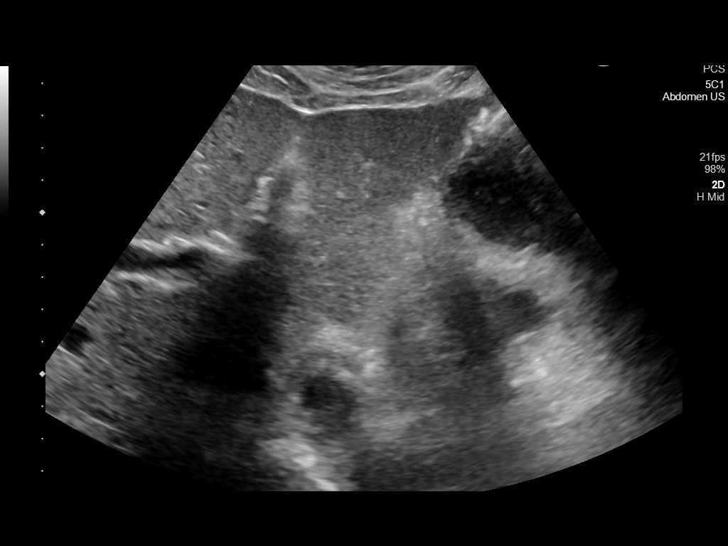
[im 52/83]
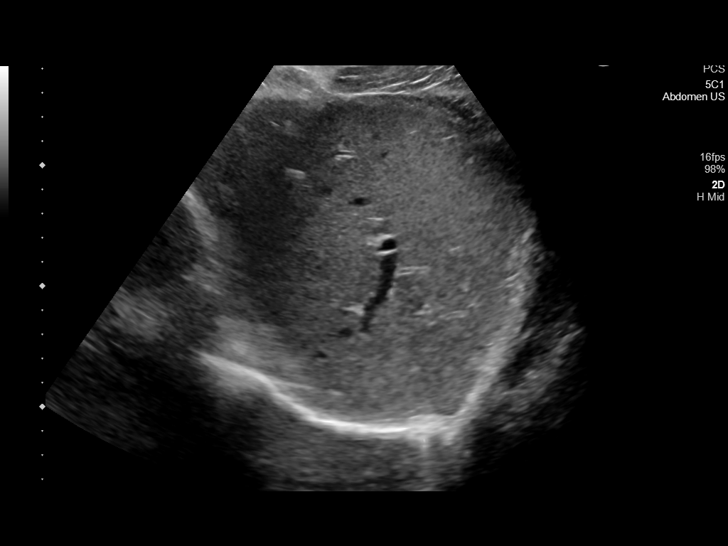
[im 55/83]
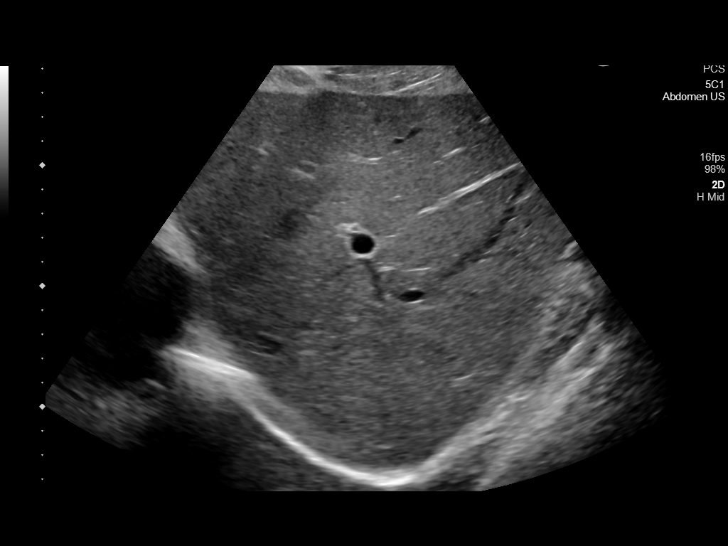
[im 62/83]
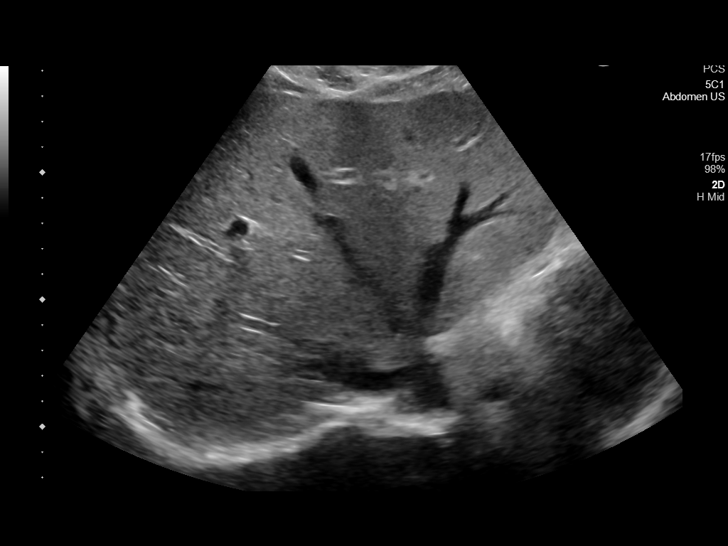
[im 69/83]
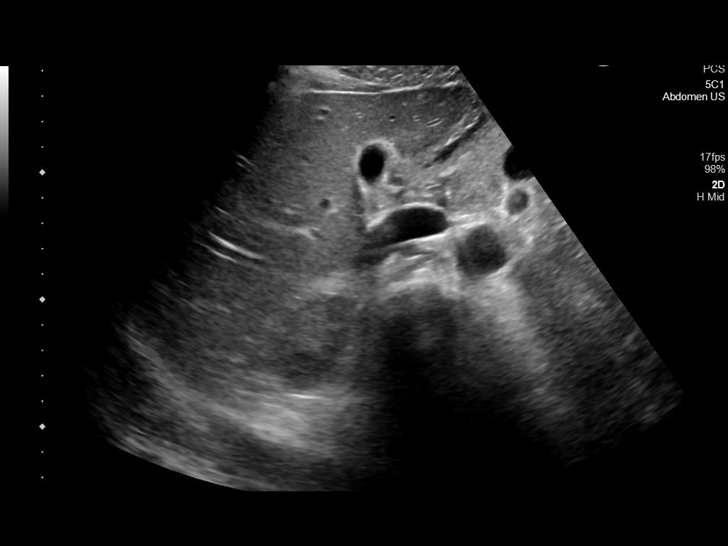
[im 76/83]
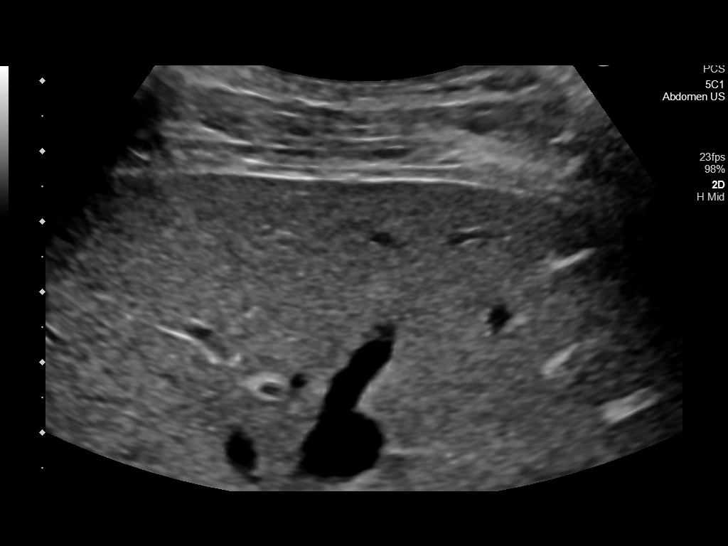
[im 83/83]
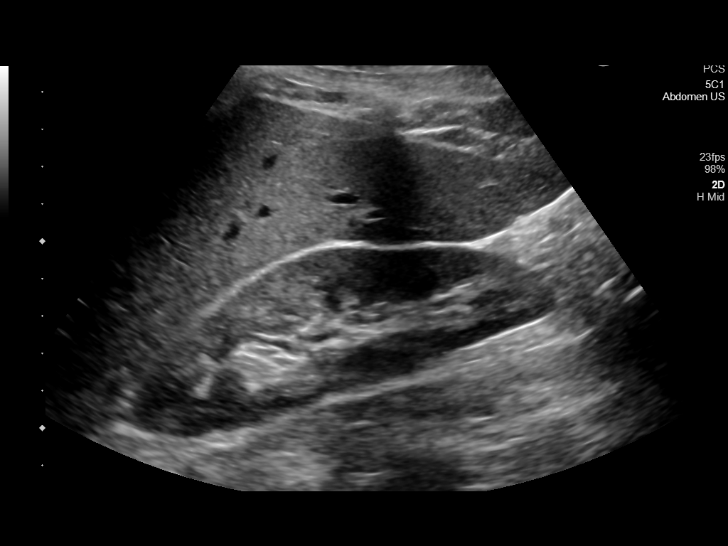

[14 of 25 positions shown; findings below may reference images not displayed]

FINDINGS: Gallbladder:

No gallstones or wall thickening visualized. No sonographic Murphy
sign noted by sonographer.

Common bile duct:

Diameter: 3 mm, within normal limits.

Liver:

No focal lesion identified. Within normal limits in parenchymal
echogenicity. Portal vein is patent on color Doppler imaging with
normal direction of blood flow towards the liver.

Other: None.
IMPRESSION: Negative. No hepatobiliary abnormality identified.

## 2022-08-21 ENCOUNTER — Other Ambulatory Visit: Payer: Self-pay

## 2022-08-21 ENCOUNTER — Encounter (HOSPITAL_BASED_OUTPATIENT_CLINIC_OR_DEPARTMENT_OTHER): Payer: Self-pay | Admitting: Emergency Medicine

## 2022-08-21 ENCOUNTER — Emergency Department (HOSPITAL_BASED_OUTPATIENT_CLINIC_OR_DEPARTMENT_OTHER)
Admission: EM | Admit: 2022-08-21 | Discharge: 2022-08-22 | Disposition: A | Discharge status: Home / Self Care | Payer: BC Managed Care – PPO | Attending: Emergency Medicine | Admitting: Emergency Medicine

## 2022-08-21 DIAGNOSIS — R197 Diarrhea, unspecified: Secondary | ICD-10-CM | POA: Diagnosis not present

## 2022-08-21 DIAGNOSIS — R112 Nausea with vomiting, unspecified: Secondary | ICD-10-CM | POA: Diagnosis present

## 2022-08-21 DIAGNOSIS — R1011 Right upper quadrant pain: Secondary | ICD-10-CM | POA: Insufficient documentation

## 2022-08-21 DIAGNOSIS — R1110 Vomiting, unspecified: Secondary | ICD-10-CM

## 2022-08-21 LAB — CBC
HCT: 44.1 % (ref 39.0–52.0)
Hemoglobin: 15.6 g/dL (ref 13.0–17.0)
MCH: 28.7 pg (ref 26.0–34.0)
MCHC: 35.4 g/dL (ref 30.0–36.0)
MCV: 81.1 fL (ref 80.0–100.0)
Platelets: 362 10*3/uL (ref 150–400)
RBC: 5.44 MIL/uL (ref 4.22–5.81)
RDW: 13.1 % (ref 11.5–15.5)
WBC: 13.7 10*3/uL — ABNORMAL HIGH (ref 4.0–10.5)
nRBC: 0 % (ref 0.0–0.2)

## 2022-08-21 LAB — COMPREHENSIVE METABOLIC PANEL
ALT: 16 U/L (ref 0–44)
AST: 16 U/L (ref 15–41)
Albumin: 5.4 g/dL — ABNORMAL HIGH (ref 3.5–5.0)
Alkaline Phosphatase: 60 U/L (ref 38–126)
Anion gap: 15 (ref 5–15)
BUN: 24 mg/dL — ABNORMAL HIGH (ref 6–20)
CO2: 26 mmol/L (ref 22–32)
Calcium: 10.4 mg/dL — ABNORMAL HIGH (ref 8.9–10.3)
Chloride: 95 mmol/L — ABNORMAL LOW (ref 98–111)
Creatinine, Ser: 1.05 mg/dL (ref 0.61–1.24)
GFR, Estimated: >60 mL/min (ref >60)
Glucose, Bld: 142 mg/dL — ABNORMAL HIGH (ref 70–99)
Potassium: 3.6 mmol/L (ref 3.5–5.1)
Sodium: 136 mmol/L (ref 135–145)
Total Bilirubin: 1.4 mg/dL — ABNORMAL HIGH (ref 0.3–1.2)
Total Protein: 8 g/dL (ref 6.5–8.1)

## 2022-08-21 LAB — LIPASE, BLOOD: Lipase: 77 U/L — ABNORMAL HIGH (ref 11–51)

## 2022-08-21 MED ORDER — HALOPERIDOL LACTATE 5 MG/ML IJ SOLN
2.0000 mg | Freq: Once | INTRAMUSCULAR | Status: AC
  Administered 2022-08-21: 2 mg via INTRAVENOUS
  Filled 2022-08-21: qty 1

## 2022-08-21 MED ORDER — LACTATED RINGERS IV BOLUS
1000.0000 mL | Freq: Once | INTRAVENOUS | Status: AC
  Administered 2022-08-21: 1000 mL via INTRAVENOUS

## 2022-08-21 MED ORDER — ACETAMINOPHEN 500 MG PO TABS
1000.0000 mg | ORAL_TABLET | Freq: Once | ORAL | Status: DC
  Filled 2022-08-21: qty 2

## 2022-08-21 MED ORDER — CAPSAICIN 0.025 % EX CREA
TOPICAL_CREAM | Freq: Once | CUTANEOUS | Status: AC
  Filled 2022-08-21: qty 60

## 2022-08-21 MED ORDER — ONDANSETRON HCL 4 MG/2ML IJ SOLN
4.0000 mg | Freq: Once | INTRAMUSCULAR | Status: AC
  Administered 2022-08-21: 4 mg via INTRAVENOUS
  Filled 2022-08-21: qty 2

## 2022-08-21 NOTE — ED Notes (Signed)
Pt still unable to provide urine sample. Encouraged to try again when he feels he is able to go.

## 2022-08-21 NOTE — ED Notes (Signed)
Spo2 88% while sleeping. 2L [place] applied. Spo2 98% with o2

## 2022-08-21 NOTE — ED Provider Notes (Signed)
[place] EMERGENCY DEPT Provider Note   CSN: [id number] Arrival date & time: 08/21/22  1804     History  Chief Complaint  Patient presents with   Abdominal Pain    [name] is a 32 y.o. male with h/o marijuana use presents with abdominal pain, nausea/vomiting.   Normally wakes up feeling nauseated but he manages with zofran. Has had nausea/vomiting, abdominal cramping, and diarrhea intermittently for approximately 1 year. Had an endoscopy scheduled for today but switched it for a class he had to attend. Today this pain has been different x 2 days with increased abdominal pain, located centrally and at times in the RUQ. Zofran did not help at home. Denies f/c, CP, SOB, cough, urinary symptoms, melena/hematochezia. Per chart review patient has been diagnosed w/ cannabinoid hyperemesis syndrome in the past. Patient states that he quit smoking marijuana approx 2 months ago and is still having symptoms.    Abdominal Pain      Home Medications Prior to Admission medications   Medication Sig Start Date End Date Taking? Authorizing Provider  acetaminophen (TYLENOL) 325 MG tablet Take 2 tablets (650 mg total) by mouth every 6 (six) hours as needed. Patient not taking: Reported on 03/29/2021 10/19/20   Chase Picket, MD  omeprazole (PRILOSEC OTC) 20 MG tablet Take 1 tablet (20 mg total) by mouth daily. 03/30/21 03/30/22  Wouk, Ailene Rud, MD  ondansetron (ZOFRAN ODT) 4 MG disintegrating tablet Take 1 tablet (4 mg total) by mouth every 8 (eight) hours as needed for nausea or vomiting. 03/30/21   Wouk, Ailene Rud, MD  ondansetron (ZOFRAN-ODT) 4 MG disintegrating tablet Take 1 tablet (4 mg total) by mouth every 8 (eight) hours as needed for nausea or vomiting. 11/10/21   Sherrie George B, FNP      Allergies    Ibuprofen    Review of Systems   Review of Systems  Gastrointestinal:  Positive for abdominal pain.   Review of systems Negative for f/c.  A 10 point  review of systems was performed and is negative unless otherwise reported in HPI.  Physical Exam Updated Vital Signs BP (!) 137/95 (BP Location: Right Arm)   Pulse (!) 59   Temp 98.2 F (36.8 C) (Oral)   Resp 12   Ht '5\' 11"'$  (1.803 m)   Wt 74.8 kg   SpO2 100%   BMI 23.01 kg/m  Physical Exam General: Normal appearing male, lying in bed.  HEENT:Sclera anicteric, MMM, trachea midline.  Cardiology: RRR, no murmurs/rubs/gallops. BL radial and DP pulses equal bilaterally.  Resp: Normal respiratory rate and effort. CTAB, no wheezes, rhonchi, crackles.  Abd: Epigastric tenderness to palpation. Soft, non-distended. No rebound tenderness or guarding. Neg murphy's sign, no TTP at Mcburny's point. GU: Deferred. MSK: No peripheral edema or signs of trauma. Extremities without deformity or TTP. No cyanosis or clubbing. Skin: warm, dry. No rashes or lesions. Back: No CVA tenderness Neuro: A&Ox4, CNs II-XII grossly intact. MAEs. Sensation grossly intact.  Psych: Normal mood and affect.   ED Results / Procedures / Treatments   Labs (all labs ordered are listed, but only abnormal results are displayed) Labs Reviewed  LIPASE, BLOOD - Abnormal; Notable for the following components:      Result Value   Lipase 77 (*)    All other components within normal limits  COMPREHENSIVE METABOLIC PANEL - Abnormal; Notable for the following components:   Chloride 95 (*)    Glucose, Bld 142 (*)    BUN  24 (*)    Calcium 10.4 (*)    Albumin 5.4 (*)    Total Bilirubin 1.4 (*)    All other components within normal limits  CBC - Abnormal; Notable for the following components:   WBC 13.7 (*)    All other components within normal limits  URINALYSIS, ROUTINE W REFLEX MICROSCOPIC - Abnormal; Notable for the following components:   Specific Gravity, Urine 1.034 (*)    Ketones, ur 40 (*)    Protein, ur 30 (*)    All other components within normal limits  RAPID URINE DRUG SCREEN, HOSP PERFORMED - Abnormal;  Notable for the following components:   Benzodiazepines POSITIVE (*)    Tetrahydrocannabinol POSITIVE (*)    All other components within normal limits    EKG EKG Interpretation  Date/Time:  Thursday August 21 2022 20:21:00 EST Ventricular Rate:  54 PR Interval:  155 QRS Duration: 102 QT Interval:  433 QTC Calculation: 411 R Axis:   91 Text Interpretation: Sinus rhythm Atrial premature complex Borderline right axis deviation Probable left ventricular hypertrophy No significant change since last tracing Confirmed by Isla Pence [phone]) on 08/22/2022 8:05:57 AM  Radiology No results found.  Procedures Procedures    Medications Ordered in ED Medications  capsaicin (ZOSTRIX) 0.025 % cream ( Topical Given 08/21/22 2028)  ondansetron (ZOFRAN) injection 4 mg (4 mg Intravenous Given 08/21/22 2026)  lactated ringers bolus 1,000 mL (0 mLs Intravenous Stopped 08/21/22 2143)  haloperidol lactate (HALDOL) injection 2 mg (2 mg Intravenous Given 08/21/22 2331)  sodium chloride 0.9 % bolus 1,000 mL (0 mLs Intravenous Stopped 08/22/22 0311)  ondansetron (ZOFRAN) injection 4 mg (4 mg Intravenous Given 08/22/22 0156)    ED Course/ Medical Decision Making/ A&P                          Medical Decision Making Amount and/or Complexity of Data Reviewed Labs: ordered.  Risk OTC drugs. Prescription drug management.    This patient presents to the ED for concern of nausea/vomiting abdominal pain, this involves an extensive number of treatment options, and is a complaint that carries with it a high risk of complications and morbidity.  I considered the following differential and admission for this acute, potentially life threatening condition. However patient is well-appearing, HDS, afebrile, overall non-toxic appearing.  MDM:    For DDX for abdominal pain includes but is not limited to:  Abdominal exam without peritoneal signs. No evidence of acute abdomen at this time. Greatest concern for  cannabinoid hyperemesis syndrome given patient's history. Given intermittent nausea/vomiting and RUQ abd pain, considered acute hepatobiliary disease but patient has negative murphy's sign. Considered acute pancreatitis, PUD/gastritis. Lower c/f acute appendicitis, vascular catastrophe, bowel obstruction,  diverticulitis.  Consider electrolyte derangements, renal injury, dehydration, hypoglycemia/hyperglycemia. Will treat patient symptomatically including with fluids and reassess.   Clinical Course as of 08/31/22 2132  Thu Aug 21, 2022  2321 After fluids, zofran, and capsaicin, patient still symptomatic. Will treat with haldol IV. [HN]    Clinical Course User Index [HN] Audley Hose, MD    Labs: I Ordered, and personally interpreted labs.  The pertinent results include:  glucose 142, BUN 24, lipase 77, t bili 1.4. WBC 13.7. Pending urine.  Additional history obtained from chart review.    Cardiac Monitoring: The patient was maintained on a cardiac monitor.  I personally viewed and interpreted the cardiac monitored which showed an underlying rhythm of: NSR  Reevaluation: After  the interventions noted above, I reevaluated the patient and found that they have :improved  Social Determinants of Health: Patient lives independently   Disposition:  Lab w/u neg for pancreatitis. T bili is very mildly elevated but AST/ALT/alk phos wnl. Mild leukocytosis to 13.7. Patient will be treated w/ fluids and antiemetics including haldol and reassessed. Abd exam still benign.  Patient is signed out to the oncoming ED physician Dr. Stark Jock who is made aware of his history, presentation, exam, workup, and plan.    Co morbidities that complicate the patient evaluation  Past Medical History:  Diagnosis Date   Skin cysts, generalized 09-15-12   cyst at present right jaw(hx. of multiple)-tx. doxycycline     Medicines Meds ordered this encounter  Medications   capsaicin (ZOSTRIX) 0.025 % cream    ondansetron (ZOFRAN) injection 4 mg   lactated ringers bolus 1,000 mL   DISCONTD: acetaminophen (TYLENOL) tablet 1,000 mg   haloperidol lactate (HALDOL) injection 2 mg   sodium chloride 0.9 % bolus 1,000 mL   ondansetron (ZOFRAN) injection 4 mg    I have reviewed the patients home medicines and have made adjustments as needed  Problem List / ED Course: Problem List Items Addressed This Visit   None Visit Diagnoses     Hyperemesis    -  Primary                   This note was created using dictation software, which may contain spelling or grammatical errors.    Audley Hose, MD 08/31/22 2137

## 2022-08-21 NOTE — ED Notes (Signed)
Unable to provide urine specimen sample at this time. Cup left at  bedside and instructions to call for assistance when able.

## 2022-08-21 NOTE — ED Triage Notes (Signed)
Pt arrives to ED with c/o abdominal pain that started yesterday. Associated w/ nausea and vomiting. Was due for endoscopy today however had to reschedule.

## 2022-08-22 LAB — RAPID URINE DRUG SCREEN, HOSP PERFORMED
Amphetamines: NOT DETECTED
Barbiturates: NOT DETECTED
Benzodiazepines: POSITIVE — AB
Cocaine: NOT DETECTED
Opiates: NOT DETECTED
Tetrahydrocannabinol: POSITIVE — AB

## 2022-08-22 LAB — URINALYSIS, ROUTINE W REFLEX MICROSCOPIC
Bacteria, UA: NONE SEEN
Bilirubin Urine: NEGATIVE
Glucose, UA: NEGATIVE mg/dL
Hgb urine dipstick: NEGATIVE
Ketones, ur: 40 mg/dL — AB
Leukocytes,Ua: NEGATIVE
Nitrite: NEGATIVE
Protein, ur: 30 mg/dL — AB
Specific Gravity, Urine: 1.034 — ABNORMAL HIGH (ref 1.005–1.030)
pH: 7 (ref 5.0–8.0)

## 2022-08-22 MED ORDER — ONDANSETRON HCL 4 MG/2ML IJ SOLN
4.0000 mg | Freq: Once | INTRAMUSCULAR | Status: AC
  Administered 2022-08-22: 4 mg via INTRAVENOUS
  Filled 2022-08-22: qty 2

## 2022-08-22 MED ORDER — SODIUM CHLORIDE 0.9 % IV BOLUS
1000.0000 mL | Freq: Once | INTRAVENOUS | Status: AC
  Administered 2022-08-22: 1000 mL via INTRAVENOUS

## 2022-08-22 NOTE — Discharge Instructions (Signed)
Clear liquid diet for the next 12 hours, then slowly advance diet as tolerated.  Continue medications as previously prescribed.

## 2022-08-22 NOTE — ED Provider Notes (Signed)
  Physical Exam  BP 109/71   Pulse (!) 57   Temp 98.7 F (37.1 C) (Oral)   Resp 18   Ht '5\' 11"'$  (1.803 m)   Wt 74.8 kg   SpO2 100%   BMI 23.01 kg/m   Physical Exam Vitals and nursing note reviewed.  Constitutional:      General: He is not in acute distress.    Appearance: He is well-developed. He is not diaphoretic.  HENT:     Head: Normocephalic and atraumatic.  Cardiovascular:     Rate and Rhythm: Normal rate and regular rhythm.     Heart sounds: No murmur heard.    No friction rub.  Pulmonary:     Effort: Pulmonary effort is normal. No respiratory distress.     Breath sounds: Normal breath sounds. No wheezing or rales.  Abdominal:     General: Bowel sounds are normal. There is no distension.     Palpations: Abdomen is soft.     Tenderness: There is no abdominal tenderness.  Musculoskeletal:        General: Normal range of motion.     Cervical back: Normal range of motion and neck supple.  Skin:    General: Skin is warm and dry.  Neurological:     Mental Status: He is alert and oriented to person, place, and time.     Coordination: Coordination normal.     Procedures  Procedures  ED Course / MDM   Clinical Course as of 08/22/22 0303  Thu Aug 21, 2022  2321 After fluids, zofran, and capsaicin, patient still symptomatic. Will treat with haldol IV. [HN]    Clinical Course User Index [HN] Audley Hose, MD   Care assumed from Dr. Mayra Neer at shift change.  Patient awaiting reassessment after receiving antiemetics and fluids.  He has history of hyperemesis, likely related to cannabis use.  He has been seen by gastroenterology in the past.  Patient has received several liters of fluid along with multiple pain and nausea medicines.  He has not vomited in the last several hours and I feel can safely be discharged.  Urinalysis is clear, but urine drug screen is again positive for marijuana.       Veryl Speak, MD 08/22/22 [phone]

## 2022-08-23 IMAGING — CT CT ABD-PELV W/ CM
2 of 4 series · 15 of 46 positions shown, 17 images · IV contrast (agent unspecified)
Comparison: CT abdomen pelvis 03/29/2021

CLINICAL DATA: Nausea and vomiting.

EXAM:
CT ABDOMEN AND PELVIS WITH CONTRAST
TECHNIQUE: Multidetector CT imaging of the abdomen and pelvis was performed
using the standard protocol following bolus administration of
intravenous contrast.

[Series 2: abdomen 5.0 · axial · 0.67mm/px · z∈[-1101,-661]mm · 12 of 98 slices shown, 14 images]
[im 5/98  soft-tissue]
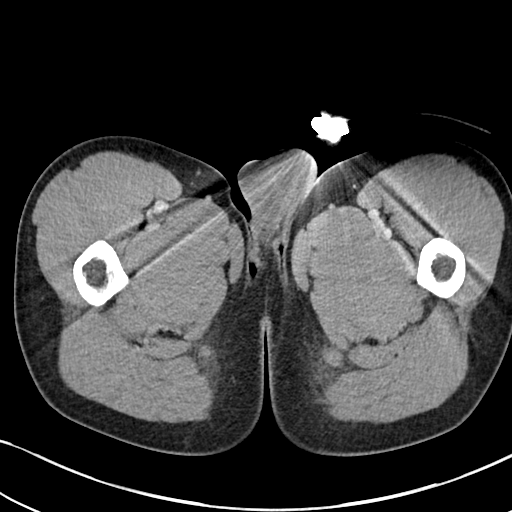
[im 5/98  bone]
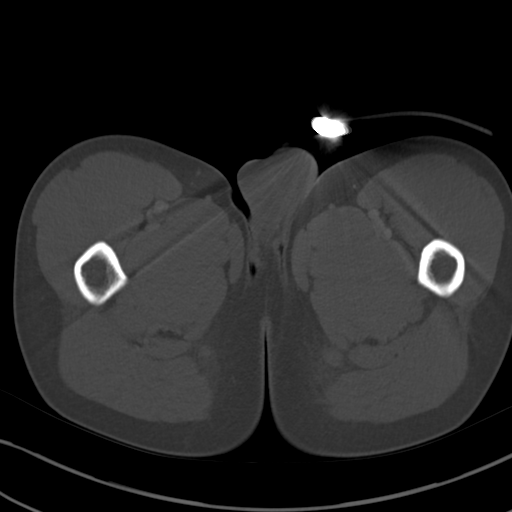
[im 14/98  soft-tissue]
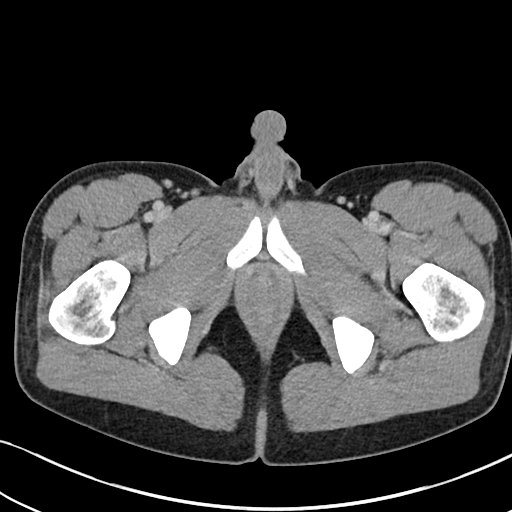
[im 24/98  soft-tissue]
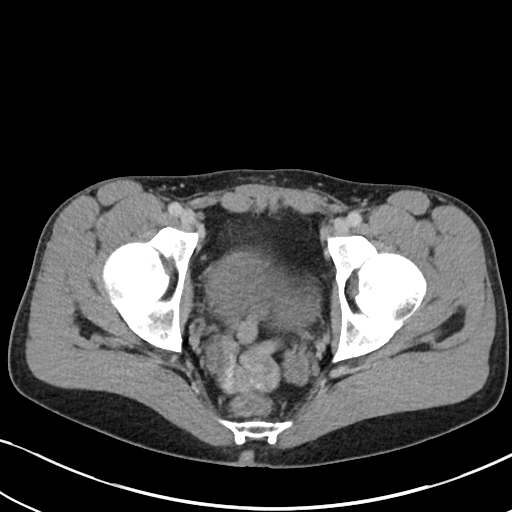
[im 28/98  soft-tissue]
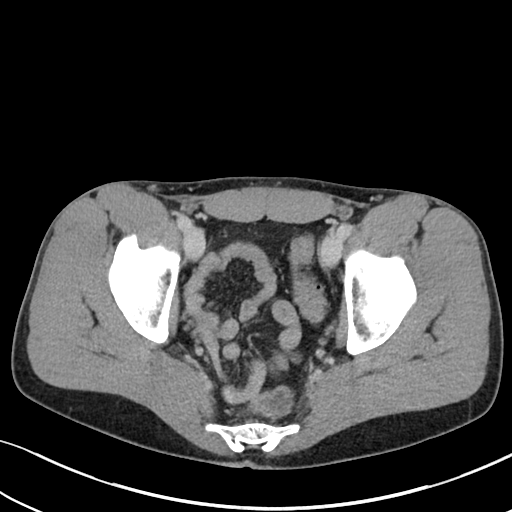
[im 37/98  soft-tissue]
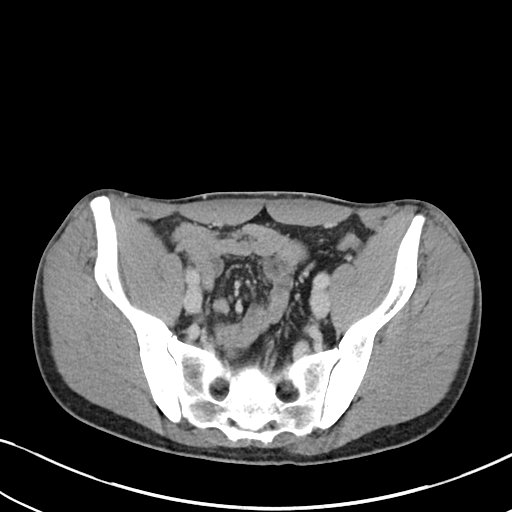
[im 47/98  soft-tissue]
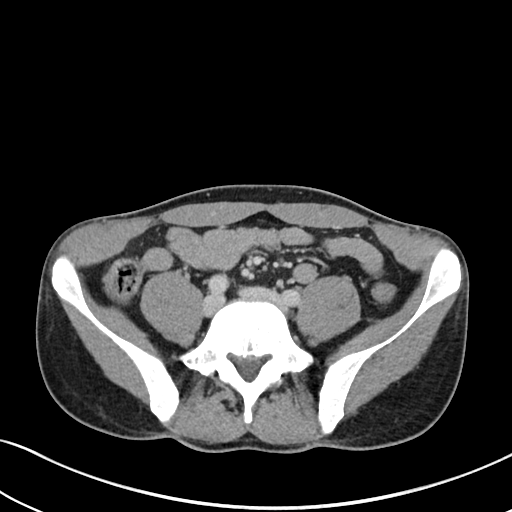
[im 51/98  soft-tissue]
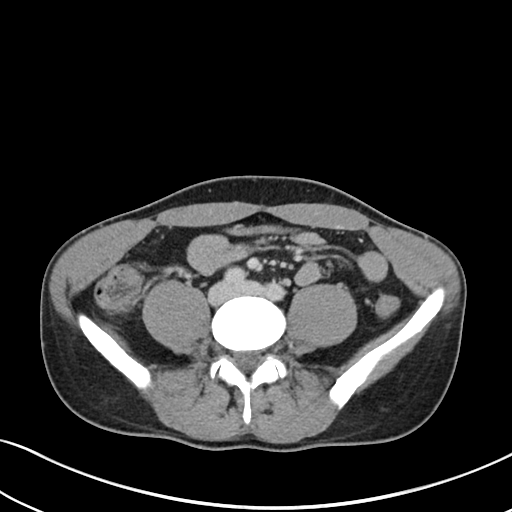
[im 61/98  soft-tissue]
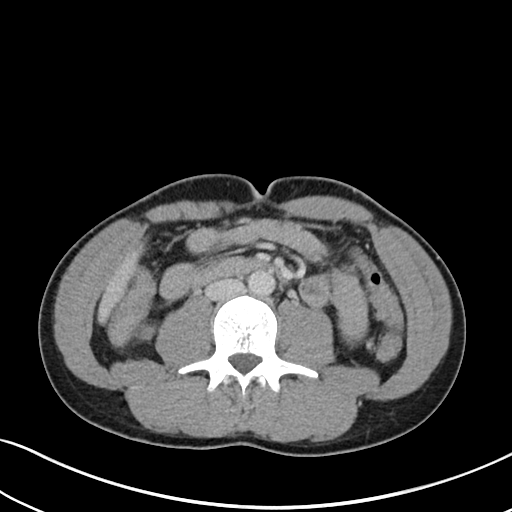
[im 70/98  soft-tissue]
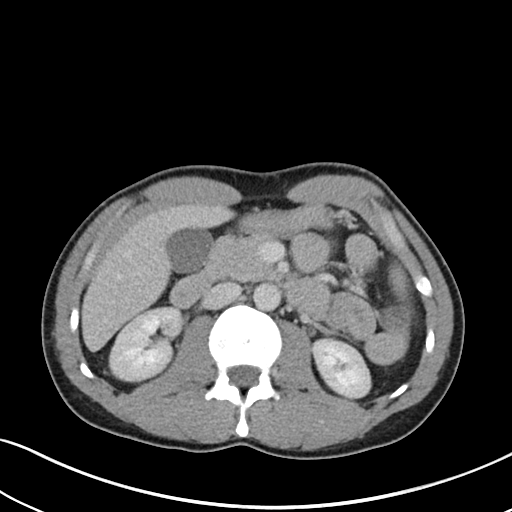
[im 70/98  bone]
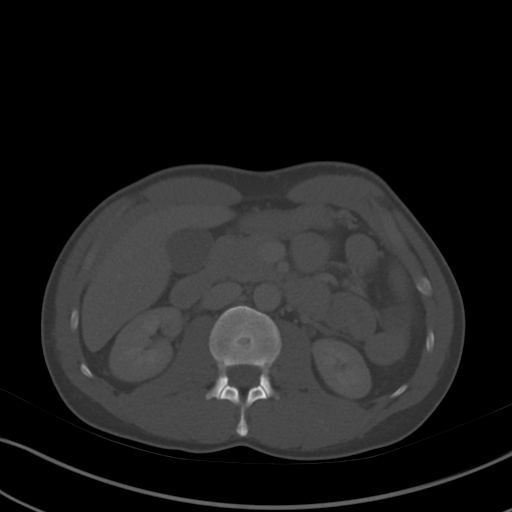
[im 74/98  soft-tissue]
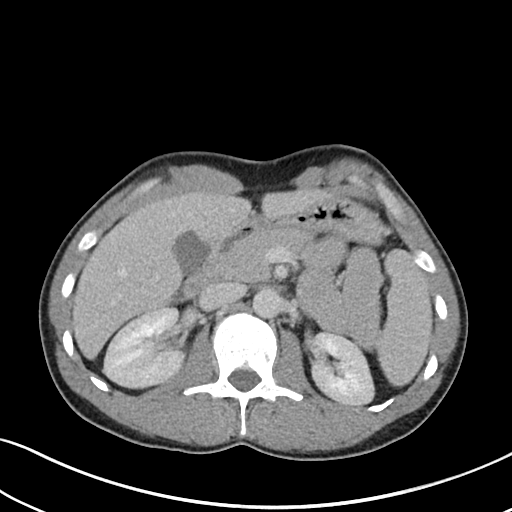
[im 84/98  soft-tissue]
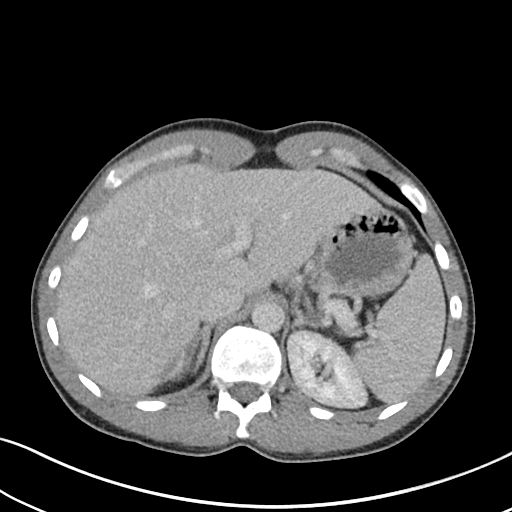
[im 93/98  soft-tissue]
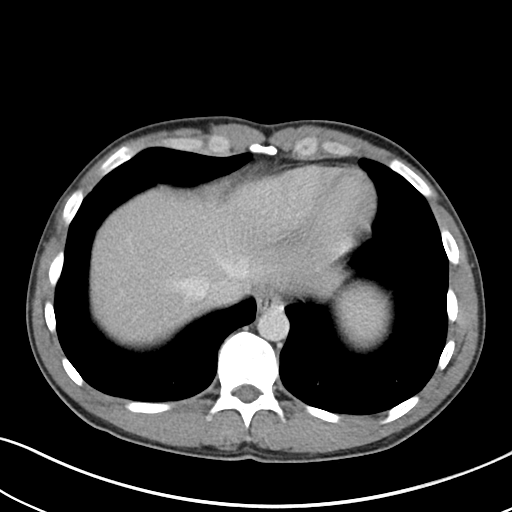

[Series 4: abdomen 3.0 mpr cor · coronal · 0.68mm/px · 3 of 81 slices shown]
[im 27/81  soft-tissue]
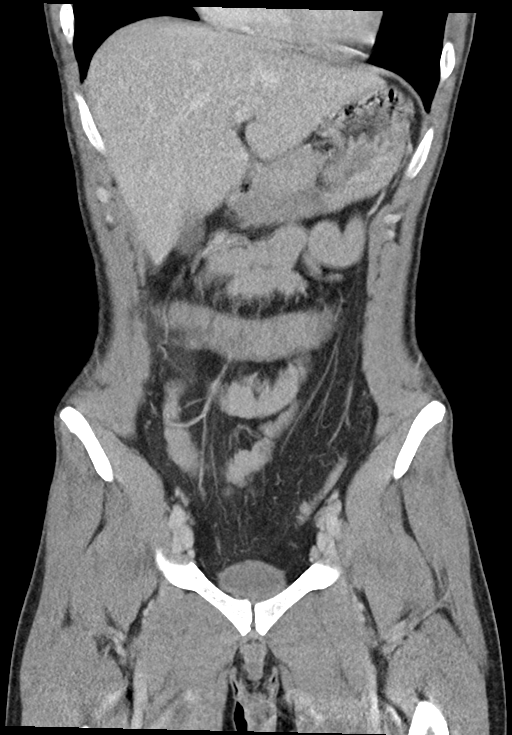
[im 36/81  soft-tissue]
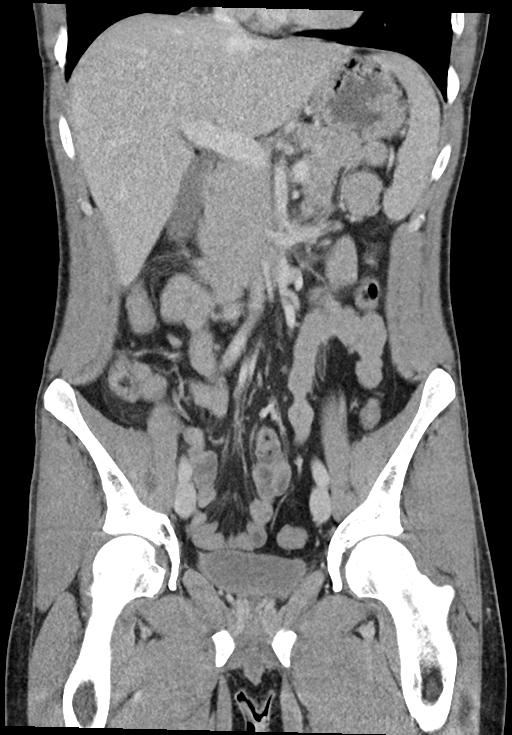
[im 45/81  soft-tissue]
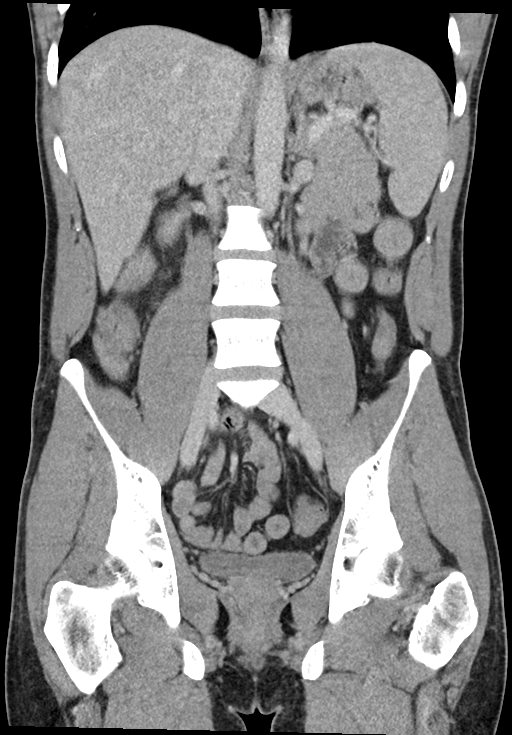

[15 of 46 positions shown; findings below may reference images not displayed]

RADIATION DOSE REDUCTION: This exam was performed according to the
departmental dose-optimization program which includes automated
exposure control, adjustment of the mA and/or kV according to
patient size and/or use of iterative reconstruction technique.

CONTRAST:  80mL OMNIPAQUE IOHEXOL 300 MG/ML  SOLN
FINDINGS: Lower chest: No acute abnormality.

Hepatobiliary: No focal liver abnormality is seen. No gallstones,
gallbladder wall thickening, or biliary dilatation.

Pancreas: Unremarkable. No pancreatic ductal dilatation or
surrounding inflammatory changes.

Spleen: Normal in size without focal abnormality.

Adrenals/Urinary Tract: Adrenal glands are unremarkable. Kidneys are
normal, without renal calculi, focal lesion, or hydronephrosis.
Bladder is unremarkable.

Stomach/Bowel: Stomach is within normal limits. Appendix appears
normal. No evidence of bowel wall thickening, distention, or
inflammatory changes.

Vascular/Lymphatic: No significant vascular findings are present. No
enlarged abdominal or pelvic lymph nodes.

Reproductive: Prostate is unremarkable.

Other: No abdominal wall hernia or abnormality. No abdominopelvic
ascites.

Musculoskeletal: No acute or suspicious osseous findings.
IMPRESSION: No acute abnormality in the abdomen or pelvis. Specifically, no
findings of cholecystitis, pancreatitis, appendicitis, or bowel
obstruction.

## 2023-05-08 ENCOUNTER — Encounter (HOSPITAL_BASED_OUTPATIENT_CLINIC_OR_DEPARTMENT_OTHER): Payer: Self-pay | Admitting: Emergency Medicine

## 2023-05-08 ENCOUNTER — Inpatient Hospital Stay (HOSPITAL_BASED_OUTPATIENT_CLINIC_OR_DEPARTMENT_OTHER)
Admission: EM | Admit: 2023-05-08 | Discharge: 2023-05-10 | DRG: 897 | Disposition: A | Discharge status: Home / Self Care | Payer: Medicaid Other | Attending: Family Medicine | Admitting: Family Medicine

## 2023-05-08 ENCOUNTER — Emergency Department (HOSPITAL_BASED_OUTPATIENT_CLINIC_OR_DEPARTMENT_OTHER): Payer: Medicaid Other

## 2023-05-08 ENCOUNTER — Other Ambulatory Visit (HOSPITAL_BASED_OUTPATIENT_CLINIC_OR_DEPARTMENT_OTHER): Payer: Self-pay

## 2023-05-08 ENCOUNTER — Other Ambulatory Visit: Payer: Self-pay

## 2023-05-08 DIAGNOSIS — I10 Essential (primary) hypertension: Secondary | ICD-10-CM | POA: Diagnosis present

## 2023-05-08 DIAGNOSIS — Z87891 Personal history of nicotine dependence: Secondary | ICD-10-CM

## 2023-05-08 DIAGNOSIS — K219 Gastro-esophageal reflux disease without esophagitis: Secondary | ICD-10-CM | POA: Diagnosis present

## 2023-05-08 DIAGNOSIS — Z79899 Other long term (current) drug therapy: Secondary | ICD-10-CM

## 2023-05-08 DIAGNOSIS — E876 Hypokalemia: Secondary | ICD-10-CM | POA: Diagnosis present

## 2023-05-08 DIAGNOSIS — Z886 Allergy status to analgesic agent status: Secondary | ICD-10-CM

## 2023-05-08 DIAGNOSIS — R112 Nausea with vomiting, unspecified: Secondary | ICD-10-CM | POA: Diagnosis present

## 2023-05-08 DIAGNOSIS — R1110 Vomiting, unspecified: Secondary | ICD-10-CM | POA: Diagnosis present

## 2023-05-08 DIAGNOSIS — R1111 Vomiting without nausea: Principal | ICD-10-CM

## 2023-05-08 DIAGNOSIS — F12188 Cannabis abuse with other cannabis-induced disorder: Secondary | ICD-10-CM | POA: Diagnosis present

## 2023-05-08 DIAGNOSIS — R161 Splenomegaly, not elsewhere classified: Secondary | ICD-10-CM | POA: Diagnosis present

## 2023-05-08 DIAGNOSIS — F1210 Cannabis abuse, uncomplicated: Principal | ICD-10-CM | POA: Diagnosis present

## 2023-05-08 DIAGNOSIS — Z7151 Drug abuse counseling and surveillance of drug abuser: Secondary | ICD-10-CM

## 2023-05-08 LAB — COMPREHENSIVE METABOLIC PANEL
ALT: 16 U/L (ref 0–44)
AST: 12 U/L — ABNORMAL LOW (ref 15–41)
Albumin: 4.7 g/dL (ref 3.5–5.0)
Alkaline Phosphatase: 46 U/L (ref 38–126)
Anion gap: 12 (ref 5–15)
BUN: 19 mg/dL (ref 6–20)
CO2: 27 mmol/L (ref 22–32)
Calcium: 9.3 mg/dL (ref 8.9–10.3)
Chloride: 100 mmol/L (ref 98–111)
Creatinine, Ser: 0.89 mg/dL (ref 0.61–1.24)
GFR, Estimated: >60 mL/min (ref >60)
Glucose, Bld: 148 mg/dL — ABNORMAL HIGH (ref 70–99)
Potassium: 3.4 mmol/L — ABNORMAL LOW (ref 3.5–5.1)
Sodium: 139 mmol/L (ref 135–145)
Total Bilirubin: 0.8 mg/dL (ref 0.3–1.2)
Total Protein: 7.3 g/dL (ref 6.5–8.1)

## 2023-05-08 LAB — CBC WITH DIFFERENTIAL/PLATELET
Abs Immature Granulocytes: 0.06 10*3/uL (ref 0.00–0.07)
Basophils Absolute: 0 10*3/uL (ref 0.0–0.1)
Basophils Relative: 0 %
Eosinophils Absolute: 0 10*3/uL (ref 0.0–0.5)
Eosinophils Relative: 0 %
HCT: 39.8 % (ref 39.0–52.0)
Hemoglobin: 13.7 g/dL (ref 13.0–17.0)
Immature Granulocytes: 0 %
Lymphocytes Relative: 5 %
Lymphs Abs: 0.7 10*3/uL (ref 0.7–4.0)
MCH: 28.2 pg (ref 26.0–34.0)
MCHC: 34.4 g/dL (ref 30.0–36.0)
MCV: 81.9 fL (ref 80.0–100.0)
Monocytes Absolute: 0.7 10*3/uL (ref 0.1–1.0)
Monocytes Relative: 5 %
Neutro Abs: 12.9 10*3/uL — ABNORMAL HIGH (ref 1.7–7.7)
Neutrophils Relative %: 90 %
Platelets: 305 10*3/uL (ref 150–400)
RBC: 4.86 MIL/uL (ref 4.22–5.81)
RDW: 13.2 % (ref 11.5–15.5)
WBC: 14.3 10*3/uL — ABNORMAL HIGH (ref 4.0–10.5)
nRBC: 0 % (ref 0.0–0.2)

## 2023-05-08 LAB — HIV ANTIBODY (ROUTINE TESTING W REFLEX): HIV Screen 4th Generation wRfx: NONREACTIVE

## 2023-05-08 LAB — LIPASE, BLOOD: Lipase: <10 U/L — ABNORMAL LOW (ref 11–51)

## 2023-05-08 MED ORDER — ACETAMINOPHEN 325 MG PO TABS: 650.0000 mg | ORAL_TABLET | Freq: Four times a day (QID) | ORAL | Status: DC | PRN

## 2023-05-08 MED ORDER — SODIUM CHLORIDE 0.9 % IV SOLN: Freq: Once | INTRAVENOUS | Status: AC

## 2023-05-08 MED ORDER — SODIUM CHLORIDE 0.9 % IV SOLN
25.0000 mg | Freq: Four times a day (QID) | INTRAVENOUS | Status: DC | PRN
  Administered 2023-05-08 – 2023-05-09 (×4): 25 mg via INTRAVENOUS
  Filled 2023-05-08 (×2): qty 25
  Filled 2023-05-08: qty 1
  Filled 2023-05-08 (×2): qty 25

## 2023-05-08 MED ORDER — SODIUM CHLORIDE 0.9 % IV BOLUS
1000.0000 mL | Freq: Once | INTRAVENOUS | Status: AC
  Administered 2023-05-08: 1000 mL via INTRAVENOUS

## 2023-05-08 MED ORDER — ONDANSETRON HCL 4 MG PO TABS: 4.0000 mg | ORAL_TABLET | Freq: Four times a day (QID) | ORAL | Status: DC | PRN

## 2023-05-08 MED ORDER — PANTOPRAZOLE SODIUM 40 MG IV SOLR
40.0000 mg | INTRAVENOUS | Status: DC
  Administered 2023-05-09: 40 mg via INTRAVENOUS
  Filled 2023-05-08: qty 10

## 2023-05-08 MED ORDER — ONDANSETRON HCL 4 MG/2ML IJ SOLN
4.0000 mg | Freq: Once | INTRAMUSCULAR | Status: AC
  Administered 2023-05-08: 4 mg via INTRAVENOUS
  Filled 2023-05-08: qty 2

## 2023-05-08 MED ORDER — HYDROMORPHONE HCL 1 MG/ML IJ SOLN
0.5000 mg | INTRAMUSCULAR | Status: DC | PRN
  Administered 2023-05-08 – 2023-05-09 (×4): 1 mg via INTRAVENOUS
  Filled 2023-05-08 (×4): qty 1

## 2023-05-08 MED ORDER — OMEPRAZOLE 20 MG PO CPDR
20.0000 mg | DELAYED_RELEASE_CAPSULE | Freq: Every day | ORAL | 0 refills | Status: DC
  Filled 2023-05-08: qty 14, 14d supply, fill #0

## 2023-05-08 MED ORDER — SUCRALFATE 1 G PO TABS
1.0000 g | ORAL_TABLET | Freq: Three times a day (TID) | ORAL | 0 refills | Status: DC
  Filled 2023-05-08: qty 56, 14d supply, fill #0

## 2023-05-08 MED ORDER — ONDANSETRON HCL 4 MG/2ML IJ SOLN
4.0000 mg | Freq: Four times a day (QID) | INTRAMUSCULAR | Status: DC | PRN
  Administered 2023-05-08 – 2023-05-09 (×3): 4 mg via INTRAVENOUS
  Filled 2023-05-08 (×3): qty 2

## 2023-05-08 MED ORDER — IOHEXOL 300 MG/ML  SOLN
100.0000 mL | Freq: Once | INTRAMUSCULAR | Status: AC | PRN
  Administered 2023-05-08: 100 mL via INTRAVENOUS

## 2023-05-08 MED ORDER — TRAZODONE HCL 50 MG PO TABS: 25.0000 mg | ORAL_TABLET | Freq: Every evening | ORAL | Status: DC | PRN

## 2023-05-08 MED ORDER — ALBUTEROL SULFATE (2.5 MG/3ML) 0.083% IN NEBU: 2.5000 mg | INHALATION_SOLUTION | RESPIRATORY_TRACT | Status: DC | PRN

## 2023-05-08 MED ORDER — ENOXAPARIN SODIUM 40 MG/0.4ML IJ SOSY
40.0000 mg | PREFILLED_SYRINGE | INTRAMUSCULAR | Status: DC
  Administered 2023-05-08 – 2023-05-09 (×2): 40 mg via SUBCUTANEOUS
  Filled 2023-05-08 (×2): qty 0.4

## 2023-05-08 MED ORDER — SODIUM CHLORIDE 0.9 % IV SOLN: INTRAVENOUS | Status: DC

## 2023-05-08 MED ORDER — ONDANSETRON 4 MG PO TBDP
4.0000 mg | ORAL_TABLET | Freq: Three times a day (TID) | ORAL | 0 refills | Status: DC | PRN
  Filled 2023-05-08 – 2023-05-26 (×2): qty 20, 7d supply, fill #0

## 2023-05-08 MED ORDER — ACETAMINOPHEN 650 MG RE SUPP: 650.0000 mg | Freq: Four times a day (QID) | RECTAL | Status: DC | PRN

## 2023-05-08 MED ORDER — PROMETHAZINE HCL 25 MG/ML IJ SOLN
INTRAMUSCULAR | Status: AC
  Filled 2023-05-08: qty 1

## 2023-05-08 MED ORDER — ONDANSETRON 4 MG PO TBDP
4.0000 mg | ORAL_TABLET | Freq: Once | ORAL | Status: AC
  Administered 2023-05-08: 4 mg via ORAL
  Filled 2023-05-08: qty 1

## 2023-05-08 MED ORDER — SODIUM CHLORIDE 0.9 % IV BOLUS
1000.0000 mL | Freq: Once | INTRAVENOUS | Status: AC
Start: 2023-05-08 — End: 2023-05-08
  Administered 2023-05-08: 1000 mL via INTRAVENOUS

## 2023-05-08 MED ORDER — PROMETHAZINE HCL 25 MG/ML IJ SOLN
12.5000 mg | Freq: Once | INTRAMUSCULAR | Status: AC
  Administered 2023-05-08: 12.5 mg via INTRAVENOUS
  Filled 2023-05-08: qty 0.5

## 2023-05-08 MED ORDER — PANTOPRAZOLE SODIUM 40 MG IV SOLR
40.0000 mg | Freq: Once | INTRAVENOUS | Status: AC
  Administered 2023-05-08: 40 mg via INTRAVENOUS
  Filled 2023-05-08: qty 10

## 2023-05-08 NOTE — ED Notes (Signed)
[name] with cl called for transport 

## 2023-05-08 NOTE — Plan of Care (Signed)

## 2023-05-08 NOTE — ED Notes (Signed)
Discharge instructions, follow up care, and prescriptions reviewed and explained, pt verbalized understanding and had no further questions on d/c. Pt caox4, ambulatory, NAD on d/c.  

## 2023-05-08 NOTE — ED Notes (Signed)
Upon d/c, pt had another episode of vomiting and requested to stay for symptom relief. MD made aware.

## 2023-05-08 NOTE — H&P (Signed)
History and Physical  [name] ZOX:[id number] DOB: 12-15-1990 DOA: 05/08/2023  PCP: Elvera Lennox, PA-C   Chief Complaint: Nausea vomiting  HPI: [name] is a 32 y.o. male with medical history significant for prior inguinal hernia repair, intractable vomiting, concern for cannabinoid hyperemesis syndrome being admitted to the hospital with recurrent intractable nausea and vomiting.  States that he was doing well until 9/26 at about 3 AM, woke up from sleep with abdominal cramping and started vomiting.  Denies any fevers, chills, hematemesis, or any blood in his stools.  Denies any recent cannabinoid use.  Was recently seen for his intermittent abdominal pain and vomiting by gastroenterology Dr. Marin Olp with [facility].  He had colonoscopy and upper endoscopy in August, per the patient this was unremarkable except for 1 polyp removed on colonoscopy.  ED Course: Presented to the ER this morning for intractable vomiting, denies abdominal pain except for cramping discomfort due to the vomiting.  Denies any bright red blood hematemesis.  Vital signs in the emergency department unremarkable except for some hypertension.  Lab work was done also unremarkable.  At the patient's request, CT scan was performed without any acute findings.  He failed oral challenge in the emergency department, vomited there.  Therefore, hospitalist admission to [facility] was requested.  Currently the patient is comfortable, states that he has some mild abdominal discomfort, mild nausea, but does not feel like he needs to throw up currently.  Review of Systems: Please see HPI for pertinent positives and negatives. A complete 10 system review of systems are otherwise negative.  Past Medical History:  Diagnosis Date   Skin cysts, generalized 09-15-12   cyst at present right jaw(hx. of multiple)-tx. doxycycline   Past Surgical History:  Procedure Laterality Date   CYST EXCISION  2011/2012   INGUINAL  HERNIA REPAIR Left 09/24/2012   Procedure: HERNIA REPAIR INGUINAL ADULT;  Surgeon: Clovis Pu. Cornett, MD;  Location: [facility];  Service: General;  Laterality: Left;   INSERTION OF MESH Left 09/24/2012   Procedure: INSERTION OF MESH;  Surgeon: Clovis Pu. Cornett, MD;  Location: [facility];  Service: General;  Laterality: Left;    Social History:  reports that he quit smoking about 9 years ago. His smoking use included cigarettes. He started smoking about 12 years ago. He has a 0.8 pack-year smoking history. He has never used smokeless tobacco. He reports current alcohol use of about 2.0 standard drinks of alcohol per week. He reports current drug use. Drug: Marijuana.   Allergies  Allergen Reactions   Ibuprofen Other (See Comments)    Makes GERD worse    Family History  Problem Relation Age of Onset   Healthy Mother    Healthy Father      Prior to Admission medications   Medication Sig Start Date End Date Taking? Authorizing Provider  omeprazole (PRILOSEC) 20 MG capsule Take 1 capsule (20 mg total) by mouth daily for 14 days. 05/08/23 05/22/23 Yes Curatolo, Adam, DO  ondansetron (ZOFRAN-ODT) 4 MG disintegrating tablet Take 1 tablet (4 mg total) by mouth every 8 (eight) hours as needed. 05/08/23  Yes Curatolo, Adam, DO  sucralfate (CARAFATE) 1 g tablet Take 1 tablet (1 g total) by mouth 4 (four) times daily -  with meals and at bedtime for 14 days. 05/08/23 05/22/23 Yes Curatolo, Adam, DO  acetaminophen (TYLENOL) 325 MG tablet Take 2 tablets (650 mg total) by mouth every 6 (six) hours as needed. Patient not taking: Reported on 03/29/2021  10/19/20   Lamptey, Britta Mccreedy, MD  omeprazole (PRILOSEC OTC) 20 MG tablet Take 1 tablet (20 mg total) by mouth daily. 03/30/21 03/30/22  Wouk, Wilfred Curtis, MD    Physical Exam: BP (!) 151/92 (BP Location: Right Arm)   Pulse (!) 59   Temp 98.9 F (37.2 C)   Resp 17   Ht 5\' 11"  (1.803 m)   Wt 72.6 kg   SpO2 100%   BMI 22.32 kg/m   General:  Alert, oriented,  calm, in no acute distress, looks tired but comfortable Eyes: EOMI, clear conjuctivae, white sclerea Neck: supple, no masses, trachea mildline  Cardiovascular: RRR, no murmurs or rubs, no peripheral edema  Respiratory: clear to auscultation bilaterally, no wheezes, no crackles  Abdomen: soft, nontender, nondistended, normal bowel tones heard  Skin: dry, no rashes  Musculoskeletal: no joint effusions, normal range of motion  Psychiatric: appropriate affect, normal speech  Neurologic: extraocular muscles intact, clear speech, moving all extremities with intact sensorium         Labs on Admission:  Basic Metabolic Panel: Recent Labs  Lab 05/08/23 0723  NA 139  K 3.4*  CL 100  CO2 27  GLUCOSE 148*  BUN 19  CREATININE 0.89  CALCIUM 9.3   Liver Function Tests: Recent Labs  Lab 05/08/23 0723  AST 12*  ALT 16  ALKPHOS 46  BILITOT 0.8  PROT 7.3  ALBUMIN 4.7   Recent Labs  Lab 05/08/23 0723  LIPASE <10*   No results for input(s): "AMMONIA" in the last 168 hours. CBC: Recent Labs  Lab 05/08/23 0723  WBC 14.3*  NEUTROABS 12.9*  HGB 13.7  HCT 39.8  MCV 81.9  PLT 305   Cardiac Enzymes: No results for input(s): "CKTOTAL", "CKMB", "CKMBINDEX", "TROPONINI" in the last 168 hours.  BNP (last 3 results) No results for input(s): "BNP" in the last 8760 hours.  ProBNP (last 3 results) No results for input(s): "PROBNP" in the last 8760 hours.  CBG: No results for input(s): "GLUCAP" in the last 168 hours.  Radiological Exams on Admission: CT ABDOMEN PELVIS W CONTRAST  Result Date: 05/08/2023 CLINICAL DATA:  Nausea and vomiting. EXAM: CT ABDOMEN AND PELVIS WITH CONTRAST TECHNIQUE: Multidetector CT imaging of the abdomen and pelvis was performed using the standard protocol following bolus administration of intravenous contrast. RADIATION DOSE REDUCTION: This exam was performed according to the departmental dose-optimization program which includes automated exposure control,  adjustment of the mA and/or kV according to patient size and/or use of iterative reconstruction technique. CONTRAST:  OMNIPAQUE IOHEXOL 300 MG/ML  SOLN COMPARISON:  CT abdomen and pelvis dated 11/10/2021 FINDINGS: Lower chest: No focal consolidation or pulmonary nodule in the lung bases. No pleural effusion or pneumothorax demonstrated. Partially imaged heart size is normal. Hepatobiliary: No focal hepatic lesions. Mild periportal edema. No intra or extrahepatic biliary ductal dilation. Normal gallbladder. Pancreas: No focal lesions or main ductal dilation. Spleen: Mildly enlarged, 13.1 cm, previously 11.4 cm. Adrenals/Urinary Tract: No adrenal nodules. No suspicious renal mass, calculi or hydronephrosis. No focal bladder wall thickening. Stomach/Bowel: Normal appearance of the stomach. No evidence of bowel wall thickening, distention, or inflammatory changes. Normal appendix. Vascular/Lymphatic: No significant vascular findings are present. No enlarged abdominal or pelvic lymph nodes. Reproductive: Prostate is unremarkable. Other: No free fluid, fluid collection, or free air. Musculoskeletal: No acute or abnormal lytic or blastic osseous lesions. IMPRESSION: 1. Mild periportal edema, nonspecific but can be seen in the setting of fluid resuscitation. 2. Mild splenomegaly, 13.1  cm, previously 11.4 cm. 3. No other acute abdominopelvic findings. Electronically Signed   By: Agustin Cree M.D.   On: 05/08/2023 11:03   DG Chest Portable 1 View  Result Date: 05/08/2023 CLINICAL DATA:  Emesis.  Chest burning. EXAM: PORTABLE CHEST 1 VIEW COMPARISON:  Chest/abdominal radiographs 01/09/2008 FINDINGS: The cardiomediastinal silhouette is within normal limits. No airspace consolidation, edema, pleural effusion, or pneumothorax is identified. No acute osseous abnormality is seen. IMPRESSION: No active disease. Electronically Signed   By: Sebastian Ache M.D.   On: 05/08/2023 08:35    Assessment/Plan CELIA GIBBONS is a  32 y.o. male with medical history significant for prior inguinal hernia repair, intractable vomiting, concern for cannabinoid hyperemesis syndrome being admitted to the hospital with recurrent intractable nausea and vomiting.    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 -Observation admission -IV fluids -Supportive care with pain and nausea medication as needed -Monitor daily labs -Check urine drug screen  Mild splenomegaly-consider outpatient follow-up   GERD-IV Protonix  DVT prophylaxis: Lovenox     Code Status: Full Code  Consults called: None  Admission status: Observation  Time spent: 53 minutes  Xavien Dauphinais Sharlette Dense MD [facility] Pager [phone]  If 7PM-7AM, please contact night-coverage [email or web address] Password [facility]  05/08/2023, 12:56 PM

## 2023-05-08 NOTE — ED Provider Notes (Addendum)
[place] EMERGENCY DEPARTMENT AT [facility] Provider Note   CSN: [id number] Arrival date & time: 05/08/23  4098     History  Chief Complaint  Patient presents with   Emesis    [name] is a 32 y.o. male.  Patient here with nausea and vomiting.  Maybe saw some blood in his vomit but no large amount of clots.  History of some hyperemesis syndrome.  Thought to be from marijuana in the past.  He has not been smoking.  He did eat some chicken wings and mac & cheese pretty late.  Denies any alcohol use abdominal pain diarrhea.  No chest pain or shortness of breath.  Feel little bit better but still nauseous.  Denies any fever or chills.  Denies any weakness numbness tingling.  The history is provided by the patient.       Home Medications Prior to Admission medications   Medication Sig Start Date End Date Taking? Authorizing Provider  omeprazole (PRILOSEC) 20 MG capsule Take 1 capsule (20 mg total) by mouth daily for 14 days. 05/08/23 05/22/23 Yes Simren Popson, DO  ondansetron (ZOFRAN-ODT) 4 MG disintegrating tablet Take 1 tablet (4 mg total) by mouth every 8 (eight) hours as needed. 05/08/23  Yes Traeger Sultana, DO  sucralfate (CARAFATE) 1 g tablet Take 1 tablet (1 g total) by mouth 4 (four) times daily -  with meals and at bedtime for 14 days. 05/08/23 05/22/23 Yes Coalton Arch, DO  acetaminophen (TYLENOL) 325 MG tablet Take 2 tablets (650 mg total) by mouth every 6 (six) hours as needed. Patient not taking: Reported on 03/29/2021 10/19/20   Merrilee Jansky, MD  omeprazole (PRILOSEC OTC) 20 MG tablet Take 1 tablet (20 mg total) by mouth daily. 03/30/21 03/30/22  Wouk, Wilfred Curtis, MD      Allergies    Ibuprofen    Review of Systems   Review of Systems  Physical Exam Updated Vital Signs  ED Triage Vitals  Encounter Vitals Group     BP 05/08/23 0706 127/76     Systolic BP Percentile --      Diastolic BP Percentile --      Pulse Rate 05/08/23 0706 64      Resp 05/08/23 0706 20     Temp 05/08/23 0706 98.3 F (36.8 C)     Temp Source 05/08/23 0706 Oral     SpO2 05/08/23 0706 99 %     Weight 05/08/23 0707 160 lb (72.6 kg)     Height 05/08/23 0707 5\' 11"  (1.803 m)     Head Circumference --      Peak Flow --      Pain Score 05/08/23 0706 7     Pain Loc --      Pain Education --      Exclude from Growth Chart --      Physical Exam Vitals and nursing note reviewed.  Constitutional:      General: He is not in acute distress.    Appearance: He is well-developed. He is not ill-appearing.  HENT:     Head: Normocephalic and atraumatic.     Nose: Nose normal.     Mouth/Throat:     Mouth: Mucous membranes are moist.  Eyes:     Extraocular Movements: Extraocular movements intact.     Conjunctiva/sclera: Conjunctivae normal.     Pupils: Pupils are equal, round, and reactive to light.  Cardiovascular:     Rate and Rhythm: Normal rate and  regular rhythm.     Pulses: Normal pulses.     Heart sounds: Normal heart sounds. No murmur heard. Pulmonary:     Effort: Pulmonary effort is normal. No respiratory distress.     Breath sounds: Normal breath sounds.  Abdominal:     Palpations: Abdomen is soft.     Tenderness: There is no abdominal tenderness.  Musculoskeletal:        General: No swelling.     Cervical back: Normal range of motion and neck supple.  Skin:    General: Skin is warm and dry.     Capillary Refill: Capillary refill takes less than 2 seconds.  Neurological:     General: No focal deficit present.     Mental Status: He is alert and oriented to person, place, and time.     Cranial Nerves: No cranial nerve deficit.     Sensory: No sensory deficit.     Motor: No weakness.     Coordination: Coordination normal.  Psychiatric:        Mood and Affect: Mood normal.     ED Results / Procedures / Treatments   Labs (all labs ordered are listed, but only abnormal results are displayed) Labs Reviewed  CBC WITH  DIFFERENTIAL/PLATELET - Abnormal; Notable for the following components:      Result Value   WBC 14.3 (*)    Neutro Abs 12.9 (*)    All other components within normal limits  COMPREHENSIVE METABOLIC PANEL - Abnormal; Notable for the following components:   Potassium 3.4 (*)    Glucose, Bld 148 (*)    AST 12 (*)    All other components within normal limits  LIPASE, BLOOD - Abnormal; Notable for the following components:   Lipase <10 (*)    All other components within normal limits    EKG None  Radiology DG Chest Portable 1 View  Result Date: 05/08/2023 CLINICAL DATA:  Emesis.  Chest burning. EXAM: PORTABLE CHEST 1 VIEW COMPARISON:  Chest/abdominal radiographs 01/09/2008 FINDINGS: The cardiomediastinal silhouette is within normal limits. No airspace consolidation, edema, pleural effusion, or pneumothorax is identified. No acute osseous abnormality is seen. IMPRESSION: No active disease. Electronically Signed   By: Sebastian Ache M.D.   On: 05/08/2023 08:35    Procedures Procedures    Medications Ordered in ED Medications  promethazine (PHENERGAN) 25 MG/ML injection (  Not Given 05/08/23 1003)  0.9 %  sodium chloride infusion (has no administration in time range)  pantoprazole (PROTONIX) injection 40 mg (40 mg Intravenous Given 05/08/23 0722)  sodium chloride 0.9 % bolus 1,000 mL (0 mLs Intravenous Stopped 05/08/23 0825)  sodium chloride 0.9 % bolus 1,000 mL (0 mLs Intravenous Stopped 05/08/23 0825)  ondansetron (ZOFRAN) injection 4 mg (4 mg Intravenous Given 05/08/23 0721)  ondansetron (ZOFRAN-ODT) disintegrating tablet 4 mg (4 mg Oral Given 05/08/23 0923)  promethazine (PHENERGAN) 12.5 mg in sodium chloride 0.9 % 50 mL IVPB (0 mg Intravenous Stopped 05/08/23 1027)  iohexol (OMNIPAQUE) 300 MG/ML solution 100 mL (100 mLs Intravenous Contrast Given 05/08/23 1009)    ED Course/ Medical Decision Making/ A&P                                 Medical Decision Making Amount and/or Complexity  of Data Reviewed Labs: ordered. Radiology: ordered.  Risk Prescription drug management. Decision regarding hospitalization.   [name] is here with nausea and vomiting.  Unremarkable vitals.  No fever.  Differential diagnosis likely gastroenteritis versus reflux versus less likely pancreatitis or cholecystitis.  Have no concern for ACS or PE or pneumonia.  Will get CBC CMP lipase and give IV fluids, IV Protonix and IV Zofran and reevaluate.  Per my review and interpretation the labs is no significant findings.  No significant leukocytosis or anemia or electrolyte abnormality.  Gallbladder and liver enzymes within normal limits.  Chest x-ray per my review interpretation shows no free air.  No acute findings otherwise.  He is feeling better.  Overall suspect hyperemesis flare or gastroparesis or gastritis.  He has no abdominal tenderness.  He has had CT scans in the past that were unremarkable.  Gallbladder and liver enzymes are normal.  Have no concern for cholecystitis or pancreatitis.   However on reevaluation patient still throwing up.  Had his IV taken out and was ready for may be discharged but threw up again still feeling nauseous.  He was given a dose of ODT Zofran and further p.o. challenge failed.  At this time I will place new IV and given some IV Phenergan.  He is requesting a CT scan which I think is reasonable although I suspect that this is likely his hyperemesis syndrome.  Will reevaluate but anticipate possible admission for further symptomatic care.  Overall patient still unable to tolerate p.o. despite multiple rounds of antiemetics.  Will start him on maintenance fluids.  To be admitted to medicine for further symptomatic care.  Awaiting radiology report for CT abdomen pelvis but overall unremarkable per my review.  This chart was dictated using voice recognition software.  Despite best efforts to proofread,  errors can occur which can change the documentation meaning.      Final Clinical Impression(s) / ED Diagnoses Final diagnoses:  Vomiting without nausea, unspecified vomiting type  Hyperemesis    Virgina Norfolk, DO 05/08/23 0840    Lockie Mola, Francisco Ostrovsky, DO 05/08/23 8295    Virgina Norfolk, DO 05/08/23 1043

## 2023-05-09 DIAGNOSIS — K219 Gastro-esophageal reflux disease without esophagitis: Secondary | ICD-10-CM | POA: Diagnosis present

## 2023-05-09 DIAGNOSIS — E876 Hypokalemia: Secondary | ICD-10-CM | POA: Diagnosis present

## 2023-05-09 DIAGNOSIS — R161 Splenomegaly, not elsewhere classified: Secondary | ICD-10-CM | POA: Diagnosis present

## 2023-05-09 DIAGNOSIS — R112 Nausea with vomiting, unspecified: Secondary | ICD-10-CM | POA: Diagnosis present

## 2023-05-09 DIAGNOSIS — F1210 Cannabis abuse, uncomplicated: Secondary | ICD-10-CM | POA: Diagnosis present

## 2023-05-09 DIAGNOSIS — Z79899 Other long term (current) drug therapy: Secondary | ICD-10-CM | POA: Diagnosis not present

## 2023-05-09 DIAGNOSIS — R1110 Vomiting, unspecified: Secondary | ICD-10-CM | POA: Diagnosis present

## 2023-05-09 DIAGNOSIS — Z87891 Personal history of nicotine dependence: Secondary | ICD-10-CM | POA: Diagnosis not present

## 2023-05-09 DIAGNOSIS — Z7151 Drug abuse counseling and surveillance of drug abuser: Secondary | ICD-10-CM | POA: Diagnosis not present

## 2023-05-09 DIAGNOSIS — F12188 Cannabis abuse with other cannabis-induced disorder: Secondary | ICD-10-CM | POA: Diagnosis present

## 2023-05-09 DIAGNOSIS — Z886 Allergy status to analgesic agent status: Secondary | ICD-10-CM | POA: Diagnosis not present

## 2023-05-09 DIAGNOSIS — I10 Essential (primary) hypertension: Secondary | ICD-10-CM | POA: Diagnosis present

## 2023-05-09 LAB — CBC
HCT: 39.8 % (ref 39.0–52.0)
Hemoglobin: 13 g/dL (ref 13.0–17.0)
MCH: 28.1 pg (ref 26.0–34.0)
MCHC: 32.7 g/dL (ref 30.0–36.0)
MCV: 86 fL (ref 80.0–100.0)
Platelets: 241 10*3/uL (ref 150–400)
RBC: 4.63 MIL/uL (ref 4.22–5.81)
RDW: 13.2 % (ref 11.5–15.5)
WBC: 11.3 10*3/uL — ABNORMAL HIGH (ref 4.0–10.5)
nRBC: 0 % (ref 0.0–0.2)

## 2023-05-09 LAB — RAPID URINE DRUG SCREEN, HOSP PERFORMED
Amphetamines: NOT DETECTED
Barbiturates: NOT DETECTED
Benzodiazepines: NOT DETECTED
Cocaine: NOT DETECTED
Opiates: POSITIVE — AB
Tetrahydrocannabinol: POSITIVE — AB

## 2023-05-09 LAB — BASIC METABOLIC PANEL
Anion gap: 8 (ref 5–15)
BUN: 13 mg/dL (ref 6–20)
CO2: 26 mmol/L (ref 22–32)
Calcium: 8.2 mg/dL — ABNORMAL LOW (ref 8.9–10.3)
Chloride: 102 mmol/L (ref 98–111)
Creatinine, Ser: 0.8 mg/dL (ref 0.61–1.24)
GFR, Estimated: >60 mL/min (ref >60)
Glucose, Bld: 115 mg/dL — ABNORMAL HIGH (ref 70–99)
Potassium: 3.4 mmol/L — ABNORMAL LOW (ref 3.5–5.1)
Sodium: 136 mmol/L (ref 135–145)

## 2023-05-09 MED ORDER — POTASSIUM CHLORIDE CRYS ER 20 MEQ PO TBCR
40.0000 meq | EXTENDED_RELEASE_TABLET | Freq: Once | ORAL | Status: AC
  Administered 2023-05-09: 40 meq via ORAL
  Filled 2023-05-09: qty 2

## 2023-05-09 NOTE — Plan of Care (Signed)

## 2023-05-09 NOTE — Progress Notes (Signed)
PROGRESS NOTE    [name]  ZOX:[id number] DOB: 05/30/1991 DOA: 05/08/2023 PCP: Elvera Lennox, PA-C   Brief Narrative:  HPI: [name] is a 32 y.o. male with medical history significant for prior inguinal hernia repair, intractable vomiting, concern for cannabinoid hyperemesis syndrome being admitted to the hospital with recurrent intractable nausea and vomiting.  States that he was doing well until 9/26 at about 3 AM, woke up from sleep with abdominal cramping and started vomiting.  Denies any fevers, chills, hematemesis, or any blood in his stools.  Denies any recent cannabinoid use.  Was recently seen for his intermittent abdominal pain and vomiting by gastroenterology Dr. Marin Olp with [facility].  He had colonoscopy and upper endoscopy in August, per the patient this was unremarkable except for 1 polyp removed on colonoscopy.   ED Course: Presented to the ER this morning for intractable vomiting, denies abdominal pain except for cramping discomfort due to the vomiting.  Denies any bright red blood hematemesis.  Vital signs in the emergency department unremarkable except for some hypertension.  Lab work was done also unremarkable.  At the patient's request, CT scan was performed without any acute findings.  He failed oral challenge in the emergency department, vomited there.  Therefore, hospitalist admission to [facility] was requested.  Currently the patient is comfortable, states that he has some mild abdominal discomfort, mild nausea, but does not feel like he needs to throw up currently.  Assessment & Plan:   Principal Problem:   Intractable vomiting with nausea  Intractable nausea and vomiting/presumed cannabinoid hyperemesis syndrome: Patient still does not feel any better than yesterday, continues to have nausea but no vomiting.  Continues to have abdominal pain.  Cannot even tolerate liquid diet.  Will continue supportive care with IV fluids and as needed antiemetic  medications and will continue clear liquid diet for another day and reassess tomorrow morning.  GERD: Continue PPI.  Hypokalemia: replenish.   DVT prophylaxis: enoxaparin (LOVENOX) injection 40 mg Start: 05/08/23 2200 SCDs Start: 05/08/23 1256   Code Status: Full Code  Family Communication:  None present at bedside.  Plan of care discussed with patient in length and he/she verbalized understanding and agreed with it.  Status is: Observation The patient will require care spanning > 2 midnights and should be moved to inpatient because: Patient is still symptomatic.   Estimated body mass index is 22.32 kg/m as calculated from the following:   Height as of this encounter: 5\' 11"  (1.803 m).   Weight as of this encounter: 72.6 kg.    Nutritional Assessment: Body mass index is 22.32 kg/m.Marland Kitchen Seen by dietician.  I agree with the assessment and plan as outlined below: Nutrition Status:        . Skin Assessment: I have examined the patient's skin and I agree with the wound assessment as performed by the wound care RN as outlined below:    Consultants:  None  Procedures:  None  Antimicrobials:  Anti-infectives (From admission, onward)    None         Subjective: Seen and examined.  Still with nausea and abdominal pain.  Objective: Vitals:   05/08/23 1231 05/08/23 1616 05/08/23 2002 05/09/23 0355  BP: (!) 151/92 (!) 152/95 (!) 151/99 135/83  Pulse: (!) 59 (!) 49 62 (!) 59  Resp: 17 16 18 18   Temp: 98.9 F (37.2 C) 98.2 F (36.8 C) 98.2 F (36.8 C) 98.2 F (36.8 C)  TempSrc:   Oral Oral  SpO2: 100% 100% 100% 100%  Weight:      Height:        Intake/Output Summary (Last 24 hours) at 05/09/2023 1018 Last data filed at 05/09/2023 0703 Gross per 24 hour  Intake 1916.12 ml  Output --  Net 1916.12 ml   Filed Weights   05/08/23 0707  Weight: 72.6 kg    Examination:  General exam: Appears calm and comfortable  Respiratory system: Clear to auscultation.  Respiratory effort normal. Cardiovascular system: S1 & S2 heard, RRR. No JVD, murmurs, rubs, gallops or clicks. No pedal edema. Gastrointestinal system: Abdomen is nondistended, soft and nontender. No organomegaly or masses felt. Normal bowel sounds heard. Central nervous system: Alert and oriented. No focal neurological deficits. Extremities: Symmetric 5 x 5 power. Skin: No rashes, lesions or ulcers Psychiatry: Judgement and insight appear normal. Mood & affect appropriate.    Data Reviewed: I have personally reviewed following labs and imaging studies  CBC: Recent Labs  Lab 05/08/23 0723 05/09/23 0737  WBC 14.3* 11.3*  NEUTROABS 12.9*  --   HGB 13.7 13.0  HCT 39.8 39.8  MCV 81.9 86.0  PLT 305 241   Basic Metabolic Panel: Recent Labs  Lab 05/08/23 0723 05/09/23 0737  NA 139 136  K 3.4* 3.4*  CL 100 102  CO2 27 26  GLUCOSE 148* 115*  BUN 19 13  CREATININE 0.89 0.80  CALCIUM 9.3 8.2*   GFR: Estimated Creatinine Clearance: 136.1 mL/min (by C-G formula based on SCr of 0.8 mg/dL). Liver Function Tests: Recent Labs  Lab 05/08/23 0723  AST 12*  ALT 16  ALKPHOS 46  BILITOT 0.8  PROT 7.3  ALBUMIN 4.7   Recent Labs  Lab 05/08/23 0723  LIPASE <10*   No results for input(s): "AMMONIA" in the last 168 hours. Coagulation Profile: No results for input(s): "INR", "PROTIME" in the last 168 hours. Cardiac Enzymes: No results for input(s): "CKTOTAL", "CKMB", "CKMBINDEX", "TROPONINI" in the last 168 hours. BNP (last 3 results) No results for input(s): "PROBNP" in the last 8760 hours. HbA1C: No results for input(s): "HGBA1C" in the last 72 hours. CBG: No results for input(s): "GLUCAP" in the last 168 hours. Lipid Profile: No results for input(s): "CHOL", "HDL", "LDLCALC", "TRIG", "CHOLHDL", "LDLDIRECT" in the last 72 hours. Thyroid Function Tests: No results for input(s): "TSH", "T4TOTAL", "FREET4", "T3FREE", "THYROIDAB" in the last 72 hours. Anemia Panel: No  results for input(s): "VITAMINB12", "FOLATE", "FERRITIN", "TIBC", "IRON", "RETICCTPCT" in the last 72 hours. Sepsis Labs: No results for input(s): "PROCALCITON", "LATICACIDVEN" in the last 168 hours.  No results found for this or any previous visit (from the past 240 hour(s)).   Radiology Studies: CT ABDOMEN PELVIS W CONTRAST  Result Date: 05/08/2023 CLINICAL DATA:  Nausea and vomiting. EXAM: CT ABDOMEN AND PELVIS WITH CONTRAST TECHNIQUE: Multidetector CT imaging of the abdomen and pelvis was performed using the standard protocol following bolus administration of intravenous contrast. RADIATION DOSE REDUCTION: This exam was performed according to the departmental dose-optimization program which includes automated exposure control, adjustment of the mA and/or kV according to patient size and/or use of iterative reconstruction technique. CONTRAST:  OMNIPAQUE IOHEXOL 300 MG/ML  SOLN COMPARISON:  CT abdomen and pelvis dated 11/10/2021 FINDINGS: Lower chest: No focal consolidation or pulmonary nodule in the lung bases. No pleural effusion or pneumothorax demonstrated. Partially imaged heart size is normal. Hepatobiliary: No focal hepatic lesions. Mild periportal edema. No intra or extrahepatic biliary ductal dilation. Normal gallbladder. Pancreas: No focal lesions or main  ductal dilation. Spleen: Mildly enlarged, 13.1 cm, previously 11.4 cm. Adrenals/Urinary Tract: No adrenal nodules. No suspicious renal mass, calculi or hydronephrosis. No focal bladder wall thickening. Stomach/Bowel: Normal appearance of the stomach. No evidence of bowel wall thickening, distention, or inflammatory changes. Normal appendix. Vascular/Lymphatic: No significant vascular findings are present. No enlarged abdominal or pelvic lymph nodes. Reproductive: Prostate is unremarkable. Other: No free fluid, fluid collection, or free air. Musculoskeletal: No acute or abnormal lytic or blastic osseous lesions. IMPRESSION: 1. Mild  periportal edema, nonspecific but can be seen in the setting of fluid resuscitation. 2. Mild splenomegaly, 13.1 cm, previously 11.4 cm. 3. No other acute abdominopelvic findings. Electronically Signed   By: Agustin Cree M.D.   On: 05/08/2023 11:03   DG Chest Portable 1 View  Result Date: 05/08/2023 CLINICAL DATA:  Emesis.  Chest burning. EXAM: PORTABLE CHEST 1 VIEW COMPARISON:  Chest/abdominal radiographs 01/09/2008 FINDINGS: The cardiomediastinal silhouette is within normal limits. No airspace consolidation, edema, pleural effusion, or pneumothorax is identified. No acute osseous abnormality is seen. IMPRESSION: No active disease. Electronically Signed   By: Sebastian Ache M.D.   On: 05/08/2023 08:35    Scheduled Meds:  enoxaparin (LOVENOX) injection  40 mg Subcutaneous Q24H   pantoprazole (PROTONIX) IV  40 mg Intravenous Q24H   Continuous Infusions:  sodium chloride 100 mL/hr at 05/09/23 0703   promethazine (PHENERGAN) injection (IM or IVPB) 25 mg (05/09/23 0440)     LOS: 0 days   Hughie Closs, MD [facility]  05/09/2023, 10:18 AM   *Please note that this is a verbal dictation therefore any spelling or grammatical errors are due to the "Dragon Medical One" system interpretation.  Please page via Amion and do not message via secure chat for urgent patient care matters. Secure chat can be used for non urgent patient care matters.  How to contact the [facility] Attending or Consulting provider 7A - 7P or covering provider during after hours 7P -7A, for this patient?  Check the care team in [facility] and look for a) attending/consulting [facility] provider listed and b) the [facility] team listed. Page or secure chat 7A-7P. Log into [email or web address] and use [place]'s universal password to access. If you do not have the password, please contact the hospital operator. Locate the [facility] provider you are looking for under [facility] and page to a number that you can be directly reached. If you still have  difficulty reaching the provider, please page the [facility] (Director on Call) for the Hospitalists listed on amion for assistance.

## 2023-05-10 DIAGNOSIS — R112 Nausea with vomiting, unspecified: Secondary | ICD-10-CM | POA: Diagnosis not present

## 2023-05-10 MED ORDER — ORAL CARE MOUTH RINSE: 15.0000 mL | OROMUCOSAL | Status: DC | PRN

## 2023-05-10 NOTE — Discharge Summary (Signed)
Physician Discharge Summary  [name] XLK:[id number] DOB: 07/10/1991 DOA: 05/08/2023  PCP: Elvera Lennox, PA-C  Admit date: 05/08/2023 Discharge date: 05/10/2023 30 Day Unplanned Readmission Risk Score    Flowsheet Row ED to Hosp-Admission (Current) from 05/08/2023 in [facility]  30 Day Unplanned Readmission Risk Score (%) 12.86 Filed at 05/10/2023 0801       This score is the patient's risk of an unplanned readmission within 30 days of being discharged (0 -100%). The score is based on dignosis, age, lab data, medications, orders, and past utilization.   Low:  0-14.9   Medium: 15-21.9   High: 22-29.9   Extreme: 30 and above          Admitted From: Home Disposition: Home  Recommendations for Outpatient Follow-up:  Follow up with PCP in 1-2 weeks Please obtain BMP/CBC in one week Please follow up with your PCP on the following pending results: Unresulted Labs (From admission, onward)    None         Home Health: None Equipment/Devices: None  Discharge Condition: Stable CODE STATUS: Full code Diet recommendation: Regular  Following HPI and ED course is copied from admitting hospitalist H&P. HPI: [name] is a 32 y.o. male with medical history significant for prior inguinal hernia repair, intractable vomiting, concern for cannabinoid hyperemesis syndrome being admitted to the hospital with recurrent intractable nausea and vomiting.  States that he was doing well until 9/26 at about 3 AM, woke up from sleep with abdominal cramping and started vomiting.  Denies any fevers, chills, hematemesis, or any blood in his stools.  Denies any recent cannabinoid use.  Was recently seen for his intermittent abdominal pain and vomiting by gastroenterology Dr. Marin Olp with [facility].  He had colonoscopy and upper endoscopy in August, per the patient this was unremarkable except for 1 polyp removed on colonoscopy.   ED Course:  Presented to the ER this morning for intractable vomiting, denies abdominal pain except for cramping discomfort due to the vomiting.  Denies any bright red blood hematemesis.  Vital signs in the emergency department unremarkable except for some hypertension.  Lab work was done also unremarkable.  At the patient's request, CT scan was performed without any acute findings.  He failed oral challenge in the emergency department, vomited there.  Therefore, hospitalist admission to [facility] was requested.  Currently the patient is comfortable, states that he has some mild abdominal discomfort, mild nausea, but does not feel like he needs to throw up currently.  Subjective: Seen and examined.  He feels well.  He has tolerated soft diet and prefers to go home.  Brief/Interim Summary: Patient was admitted with the following.  Intractable nausea and vomiting/presumed cannabinoid hyperemesis syndrome/cannabis abuse: Treated symptomatically.  Doing well.  No more nausea or vomiting.  Tolerated soft diet.  Wants to go home.  Lengthy discussion about counseling about quitting substance abuse.   GERD: Continue PPI.   Hypokalemia: replenished.    Discharge plan was discussed with patient and/or family member and they verbalized understanding and agreed with it.  Discharge Diagnoses:  Principal Problem:   Intractable vomiting with nausea Active Problems:   Cannabis hyperemesis syndrome concurrent with and due to cannabis abuse [facility])    Discharge Instructions   Allergies as of 05/10/2023       Reactions   Ibuprofen Other (See Comments)   Makes GERD worse        Medication List  TAKE these medications    acetaminophen 325 MG tablet Commonly known as: Tylenol Take 2 tablets (650 mg total) by mouth every 6 (six) hours as needed.   omeprazole 20 MG capsule Commonly known as: PRILOSEC Take 1 capsule (20 mg total) by mouth daily for 14 days.   omeprazole 20 MG tablet Commonly known as:  PriLOSEC OTC Take 1 tablet (20 mg total) by mouth daily.   ondansetron 4 MG disintegrating tablet Commonly known as: ZOFRAN-ODT Take 1 tablet (4 mg total) by mouth every 8 (eight) hours as needed. What changed:  reasons to take this Another medication with the same name was removed. Continue taking this medication, and follow the directions you see here.   sucralfate 1 g tablet Commonly known as: Carafate Take 1 tablet (1 g total) by mouth 4 (four) times daily -  with meals and at bedtime for 14 days.        Follow-up Information     Elvera Lennox, PA-C. Schedule an appointment as soon as possible for a visit in 3 days.   Specialty: Physician Assistant Contact information: [phone] [address] [place] [address] [phone]         [facility] Emergency Department at [facility] .   Specialty: Emergency Medicine Why: As needed, If symptoms worsen Contact information: [address] [place] [phone]               Allergies  Allergen Reactions   Ibuprofen Other (See Comments)    Makes GERD worse    Consultations: None   Procedures/Studies: CT ABDOMEN PELVIS W CONTRAST  Result Date: 05/08/2023 CLINICAL DATA:  Nausea and vomiting. EXAM: CT ABDOMEN AND PELVIS WITH CONTRAST TECHNIQUE: Multidetector CT imaging of the abdomen and pelvis was performed using the standard protocol following bolus administration of intravenous contrast. RADIATION DOSE REDUCTION: This exam was performed according to the departmental dose-optimization program which includes automated exposure control, adjustment of the mA and/or kV according to patient size and/or use of iterative reconstruction technique. CONTRAST:  OMNIPAQUE IOHEXOL 300 MG/ML  SOLN COMPARISON:  CT abdomen and pelvis dated 11/10/2021 FINDINGS: Lower chest: No focal consolidation or pulmonary nodule in the lung bases. No pleural effusion or pneumothorax  demonstrated. Partially imaged heart size is normal. Hepatobiliary: No focal hepatic lesions. Mild periportal edema. No intra or extrahepatic biliary ductal dilation. Normal gallbladder. Pancreas: No focal lesions or main ductal dilation. Spleen: Mildly enlarged, 13.1 cm, previously 11.4 cm. Adrenals/Urinary Tract: No adrenal nodules. No suspicious renal mass, calculi or hydronephrosis. No focal bladder wall thickening. Stomach/Bowel: Normal appearance of the stomach. No evidence of bowel wall thickening, distention, or inflammatory changes. Normal appendix. Vascular/Lymphatic: No significant vascular findings are present. No enlarged abdominal or pelvic lymph nodes. Reproductive: Prostate is unremarkable. Other: No free fluid, fluid collection, or free air. Musculoskeletal: No acute or abnormal lytic or blastic osseous lesions. IMPRESSION: 1. Mild periportal edema, nonspecific but can be seen in the setting of fluid resuscitation. 2. Mild splenomegaly, 13.1 cm, previously 11.4 cm. 3. No other acute abdominopelvic findings. Electronically Signed   By: Agustin Cree M.D.   On: 05/08/2023 11:03   DG Chest Portable 1 View  Result Date: 05/08/2023 CLINICAL DATA:  Emesis.  Chest burning. EXAM: PORTABLE CHEST 1 VIEW COMPARISON:  Chest/abdominal radiographs 01/09/2008 FINDINGS: The cardiomediastinal silhouette is within normal limits. No airspace consolidation, edema, pleural effusion, or pneumothorax is identified. No acute osseous abnormality is seen. IMPRESSION: No active  disease. Electronically Signed   By: Sebastian Ache M.D.   On: 05/08/2023 08:35     Discharge Exam: Vitals:   05/09/23 2026 05/10/23 0451  BP: 135/87 129/81  Pulse: (!) 58 (!) 59  Resp: 18 18  Temp: 98.3 F (36.8 C) 97.7 F (36.5 C)  SpO2: 99% 98%   Vitals:   05/09/23 0355 05/09/23 1201 05/09/23 2026 05/10/23 0451  BP: 135/83 (!) 144/89 135/87 129/81  Pulse: (!) 59 (!) 53 (!) 58 (!) 59  Resp: 18 20 18 18   Temp: 98.2 F (36.8 C) 97.9  F (36.6 C) 98.3 F (36.8 C) 97.7 F (36.5 C)  TempSrc: Oral  Oral Oral  SpO2: 100% 100% 99% 98%  Weight:      Height:        General: Pt is alert, awake, not in acute distress Cardiovascular: RRR, S1/S2 +, no rubs, no gallops Respiratory: CTA bilaterally, no wheezing, no rhonchi Abdominal: Soft, NT, ND, bowel sounds + Extremities: no edema, no cyanosis    The results of significant diagnostics from this hospitalization (including imaging, microbiology, ancillary and laboratory) are listed below for reference.     Microbiology: No results found for this or any previous visit (from the past 240 hour(s)).   Labs: BNP (last 3 results) No results for input(s): "BNP" in the last 8760 hours. Basic Metabolic Panel: Recent Labs  Lab 05/08/23 0723 05/09/23 0737  NA 139 136  K 3.4* 3.4*  CL 100 102  CO2 27 26  GLUCOSE 148* 115*  BUN 19 13  CREATININE 0.89 0.80  CALCIUM 9.3 8.2*   Liver Function Tests: Recent Labs  Lab 05/08/23 0723  AST 12*  ALT 16  ALKPHOS 46  BILITOT 0.8  PROT 7.3  ALBUMIN 4.7   Recent Labs  Lab 05/08/23 0723  LIPASE <10*   No results for input(s): "AMMONIA" in the last 168 hours. CBC: Recent Labs  Lab 05/08/23 0723 05/09/23 0737  WBC 14.3* 11.3*  NEUTROABS 12.9*  --   HGB 13.7 13.0  HCT 39.8 39.8  MCV 81.9 86.0  PLT 305 241   Cardiac Enzymes: No results for input(s): "CKTOTAL", "CKMB", "CKMBINDEX", "TROPONINI" in the last 168 hours. BNP: Invalid input(s): "POCBNP" CBG: No results for input(s): "GLUCAP" in the last 168 hours. D-Dimer No results for input(s): "DDIMER" in the last 72 hours. Hgb A1c No results for input(s): "HGBA1C" in the last 72 hours. Lipid Profile No results for input(s): "CHOL", "HDL", "LDLCALC", "TRIG", "CHOLHDL", "LDLDIRECT" in the last 72 hours. Thyroid function studies No results for input(s): "TSH", "T4TOTAL", "T3FREE", "THYROIDAB" in the last 72 hours.  Invalid input(s): "FREET3" Anemia work up No  results for input(s): "VITAMINB12", "FOLATE", "FERRITIN", "TIBC", "IRON", "RETICCTPCT" in the last 72 hours. Urinalysis    Component Value Date/Time   COLORURINE YELLOW 08/22/2022 0149   APPEARANCEUR CLEAR 08/22/2022 0149   LABSPEC 1.034 (H) 08/22/2022 0149   PHURINE 7.0 08/22/2022 0149   GLUCOSEU NEGATIVE 08/22/2022 0149   HGBUR NEGATIVE 08/22/2022 0149   BILIRUBINUR NEGATIVE 08/22/2022 0149   KETONESUR 40 (A) 08/22/2022 0149   PROTEINUR 30 (A) 08/22/2022 0149   NITRITE NEGATIVE 08/22/2022 0149   LEUKOCYTESUR NEGATIVE 08/22/2022 0149   Sepsis Labs Recent Labs  Lab 05/08/23 0723 05/09/23 0737  WBC 14.3* 11.3*   Microbiology No results found for this or any previous visit (from the past 240 hour(s)).  FURTHER DISCHARGE INSTRUCTIONS:   Get Medicines reviewed and adjusted: Please take all your medications with you  for your next visit with your Primary MD   Laboratory/radiological data: Please request your Primary MD to go over all hospital tests and procedure/radiological results at the follow up, please ask your Primary MD to get all Hospital records sent to his/her office.   In some cases, they will be blood work, cultures and biopsy results pending at the time of your discharge. Please request that your primary care M.D. goes through all the records of your hospital data and follows up on these results.   Also Note the following: If you experience worsening of your admission symptoms, develop shortness of breath, life threatening emergency, suicidal or homicidal thoughts you must seek medical attention immediately by calling 911 or calling your MD immediately  if symptoms less severe.   You must read complete instructions/literature along with all the possible adverse reactions/side effects for all the Medicines you take and that have been prescribed to you. Take any new Medicines after you have completely understood and accpet all the possible adverse reactions/side effects.     Do not drive when taking Pain medications or sleeping medications (Benzodaizepines)   Do not take more than prescribed Pain, Sleep and Anxiety Medications. It is not advisable to combine anxiety,sleep and pain medications without talking with your primary care practitioner   Special Instructions: If you have smoked or chewed Tobacco  in the last 2 yrs please stop smoking, stop any regular Alcohol  and or any Recreational drug use.   Wear Seat belts while driving.   Please note: You were cared for by a hospitalist during your hospital stay. Once you are discharged, your primary care physician will handle any further medical issues. Please note that NO REFILLS for any discharge medications will be authorized once you are discharged, as it is imperative that you return to your primary care physician (or establish a relationship with a primary care physician if you do not have one) for your post hospital discharge needs so that they can reassess your need for medications and monitor your lab values  Time coordinating discharge: Over 30 minutes  SIGNED:   Hughie Closs, MD  [facility] 05/10/2023, 10:02 AM *Please note that this is a verbal dictation therefore any spelling or grammatical errors are due to the "Dragon Medical One" system interpretation. If 7PM-7AM, please contact night-coverage [email or web address]

## 2023-05-10 NOTE — Progress Notes (Signed)
Transition of Care [facility]) - Inpatient Brief Assessment   Patient Details  Name: [name] MRN: [id number] Date of Birth: 1991-06-27  Transition of Care [facility]) CM/SW Contact:    Adrian Prows, RN Phone Number: 05/10/2023, 10:26 AM   Clinical Narrative: Spoke w/ pt in room; he identified POC father [name] [phone].   Transition of Care Asessment: Insurance and Status: Insurance coverage has been reviewed Patient has primary care physician: Yes Home environment has been reviewed: yes Prior level of function:: independent Prior/Current Home Services: No current home services Social Determinants of Health Reivew: SDOH reviewed no interventions necessary Readmission risk has been reviewed: Yes Transition of care needs: no transition of care needs at this time

## 2023-05-10 NOTE — Plan of Care (Signed)

## 2023-05-19 ENCOUNTER — Other Ambulatory Visit (HOSPITAL_BASED_OUTPATIENT_CLINIC_OR_DEPARTMENT_OTHER): Payer: Self-pay

## 2023-05-26 ENCOUNTER — Other Ambulatory Visit (HOSPITAL_BASED_OUTPATIENT_CLINIC_OR_DEPARTMENT_OTHER): Payer: Self-pay

## 2023-12-13 ENCOUNTER — Encounter (HOSPITAL_BASED_OUTPATIENT_CLINIC_OR_DEPARTMENT_OTHER): Payer: Self-pay | Admitting: Emergency Medicine

## 2023-12-13 ENCOUNTER — Emergency Department (HOSPITAL_BASED_OUTPATIENT_CLINIC_OR_DEPARTMENT_OTHER)
Admission: EM | Admit: 2023-12-13 | Discharge: 2023-12-13 | Disposition: A | Discharge status: Home / Self Care | Attending: Emergency Medicine | Admitting: Emergency Medicine

## 2023-12-13 DIAGNOSIS — Y990 Civilian activity done for income or pay: Secondary | ICD-10-CM | POA: Diagnosis not present

## 2023-12-13 DIAGNOSIS — X58XXXA Exposure to other specified factors, initial encounter: Secondary | ICD-10-CM | POA: Diagnosis not present

## 2023-12-13 DIAGNOSIS — T1502XA Foreign body in cornea, left eye, initial encounter: Secondary | ICD-10-CM | POA: Insufficient documentation

## 2023-12-13 DIAGNOSIS — T1592XA Foreign body on external eye, part unspecified, left eye, initial encounter: Secondary | ICD-10-CM

## 2023-12-13 MED ORDER — ERYTHROMYCIN 5 MG/GM OP OINT: TOPICAL_OINTMENT | OPHTHALMIC | 0 refills | Status: DC

## 2023-12-13 MED ORDER — TETRACAINE HCL 0.5 % OP SOLN
2.0000 [drp] | Freq: Once | OPHTHALMIC | Status: AC
  Administered 2023-12-13: 2 [drp] via OPHTHALMIC
  Filled 2023-12-13: qty 4

## 2023-12-13 MED ORDER — FLUORESCEIN SODIUM 1 MG OP STRP
1.0000 | ORAL_STRIP | Freq: Once | OPHTHALMIC | Status: AC
  Administered 2023-12-13: 1 via OPHTHALMIC
  Filled 2023-12-13: qty 1

## 2023-12-13 NOTE — ED Triage Notes (Signed)
 Pt c/o pain in L eye since yesterday at work. Attempted flushing it multiple times and no relief.

## 2023-12-13 NOTE — Discharge Instructions (Addendum)
 As discussed, you did have a metallic foreign body in your left eye that was removed while in the emergency department.  You do have a scratch now on your eye.  Will place you on antibiotic ointment for your left eye.  Symptoms should improve over the next 2 to 3 days.  Recommend follow-up with an eye specialist next week.  Attached is the eye specialist we use in the emergency department at [facility].  You may take Tylenol /ibuprofen  for any pain.  Please do not hesitate to return if the worrisome signs and symptoms we discussed become apparent.

## 2023-12-13 NOTE — ED Notes (Signed)
 Discharge paperwork given and verbally understood.

## 2023-12-13 NOTE — ED Provider Notes (Signed)
 [name]   CSN: [id number] Arrival date & time: 12/13/23  5366     History  Chief Complaint  Patient presents with   Foreign Body in [facility] [name] is a 33 y.o. male.   Foreign Body in Eye   33 year old male presents emergency department with complaints of foreign body in eye.  States that he was cutting a cast iron pipe yesterday not wearing eye protection and felt like a piece of the metal went into his left eye.  Denies any changes in vision/blurry vision.  Denies any contact lens use or glasses use.  States that his had foreign body sensation in his left eye ever since then.  Tried to irrigate his eye without improvement.  Presents emergency department for further assessment.  Past medical history significant for inguinal hernia, cannabis hyperemesis syndrome  Home Medications Prior to Admission medications   Medication Sig Start Date End Date Taking? Authorizing Provider  acetaminophen  (TYLENOL ) 325 MG tablet Take 2 tablets (650 mg total) by mouth every 6 (six) hours as needed. Patient not taking: Reported on 03/29/2021 10/19/20   Corine Dice, [name]  omeprazole  (PRILOSEC  OTC) 20 MG tablet Take 1 tablet (20 mg total) by mouth daily. 03/30/21 03/30/22  Wouk, Haynes Lips, [name]  omeprazole  (PRILOSEC ) 20 MG capsule Take 1 capsule (20 mg total) by mouth daily for 14 days. 05/08/23 05/22/23  Curatolo, Adam, DO  ondansetron  (ZOFRAN -ODT) 4 MG disintegrating tablet Dissolve 1 tablet (4 mg total) by mouth every 8 (eight) hours as needed. 05/08/23   Curatolo, Adam, DO  sucralfate  (CARAFATE ) 1 g tablet Take 1 tablet (1 g total) by mouth 4 (four) times daily -  with meals and at bedtime for 14 days. 05/08/23 05/22/23  Lowery Rue, DO      Allergies    Ibuprofen     Review of Systems   Review of Systems  All other systems reviewed and are negative.   Physical Exam Updated Vital Signs Ht 5\' 11"  (1.803 m)   Wt 70.3  kg   BMI 21.62 kg/m  Physical Exam Vitals and nursing [name] reviewed.  Constitutional:      General: He is not in acute distress.    Appearance: He is well-developed.  HENT:     Head: Normocephalic and atraumatic.  Eyes:     Extraocular Movements: Extraocular movements intact.     Conjunctiva/sclera: Conjunctivae normal.     Pupils: Pupils are equal, round, and reactive to light.     Comments: Punctate area superior aspect of iris appreciated on exam.  Fluorescein  stain exam showed uptake around this area without uptake otherwise.  Seidel sign negative.  No obvious corneal abrasion.  Attempted to flush out object in eye without success.  Subsequently, tried to use Q-tip to remove without success.  Subsequently used 18-gauge needle parallel with the surface of eye to gently remove object.  Foreseen stain subsequently showed abrasion present but still with Seidel sign negative.  See nurses [name] for visual acuity.  Cardiovascular:     Rate and Rhythm: Normal rate and regular rhythm.     Heart sounds: No murmur heard. Pulmonary:     Effort: Pulmonary effort is normal. No respiratory distress.     Breath sounds: Normal breath sounds.  Abdominal:     Palpations: Abdomen is soft.     Tenderness: There is no abdominal tenderness.  Musculoskeletal:        General:  No swelling.     Cervical back: Neck supple.  Skin:    General: Skin is warm and dry.     Capillary Refill: Capillary refill takes less than 2 seconds.  Neurological:     Mental Status: He is alert.  Psychiatric:        Mood and Affect: Mood normal.     ED Results / Procedures / Treatments   Labs (all labs ordered are listed, but only abnormal results are displayed) Labs Reviewed - No data to display  EKG None  Radiology No results found.  Procedures .Foreign Body Removal  Date/Time: 12/13/2023 10:54 AM  Performed by: David Butter, [name] Authorized by: David Butter, [name]  Consent: Verbal consent  obtained. Risks and benefits: risks, benefits and alternatives were discussed Consent given by: patient Patient understanding: patient states understanding of the procedure being performed Patient consent: the patient's understanding of the procedure matches consent given Procedure consent: procedure consent matches procedure scheduled Body area: eye Location details: left cornea Anesthesia method: Topical tetracaine.  Sedation: Patient sedated: no  Localization method: visualized and eyelid eversion Removal mechanism: irrigation, moist cotton swab and 25-gauge needle Eye examined with fluorescein . Fluorescein  uptake. Corneal abrasion size: small Corneal abrasion location: inferior No residual rust ring present. Depth: superficial Complexity: simple 1 objects recovered. Post-procedure assessment: foreign body removed Patient tolerance: patient tolerated the procedure well with no immediate complications      Medications Ordered in ED Medications  tetracaine (PONTOCAINE) 0.5 % ophthalmic solution 2 drop (has no administration in time range)  fluorescein  ophthalmic strip 1 strip (has no administration in time range)    ED Course/ Medical Decision Making/ A&P                                 Medical Decision Making Risk Prescription drug management.   This patient presents to the ED for concern of foreign body in eye, this involves an extensive number of treatment options, and is a complaint that carries with it a high risk of complications and morbidity.  The differential diagnosis includes corneal abrasion, corneal ulceration, foreign body in eye, global perforation/penetration.   Co morbidities that complicate the patient evaluation  See HPI   Additional history obtained:  Additional history obtained from EMR External records from outside source obtained and reviewed including hospital records   Lab Tests:  N/a   Imaging Studies  ordered:  N/a   Cardiac Monitoring: / EKG:  N/a   Consultations Obtained:  N/a   Problem List / ED Course / Critical interventions / Medication management  Foreign body in eye I ordered medication including tetracaine, fluorescein    Reevaluation of the patient after these medicines showed that the patient improved I have reviewed the patients home medicines and have made adjustments as needed   Social Determinants of Health:  Former cigarette use.  Denies illicit drug use.   Test / Admission - Considered:  Foreign body in eye Vitals signs within normal range and stable throughout visit. 33 year old male presents emergency department with complaints of foreign body in eye.  States that he was cutting a cast iron pipe yesterday not wearing eye protection and felt like a piece of the metal went into his left eye.  Denies any changes in vision/blurry vision.  Denies any contact lens use or glasses use.  States that his had foreign body sensation in his left eye ever since then.  Tried to irrigate his eye without improvement.  Presents emergency department for further assessment. On exam, foreign body appreciated superior aspect of iris on the left side.  Visual acuity at baseline.  Seidel sign negative.  Foreign body was removed in manner as above with reassessment afterwards showing corneal abrasion present.  Will place patient on topical antibiotics and recommend follow-up with eye specialist in the outpatient setting.  Treatment plan discussed with patient and he acknowledged understanding was agreeable to said plan.  Patient will well-appearing, afebrile in no acute distress. Worrisome signs and symptoms were discussed with the patient, and the patient acknowledged understanding to return to the ED if noticed. Patient was stable upon discharge.          Final Clinical Impression(s) / ED Diagnoses Final diagnoses:  None    Rx / DC Orders ED Discharge Orders     None          Oologah [name], [place] 12/13/23 1055    David Barns, [name] 12/15/23 [phone]

## 2024-04-14 ENCOUNTER — Ambulatory Visit: Admitting: Nurse Practitioner

## 2024-04-14 ENCOUNTER — Encounter: Payer: Self-pay | Admitting: Family Medicine

## 2024-04-14 ENCOUNTER — Other Ambulatory Visit (HOSPITAL_COMMUNITY)
Admission: RE | Admit: 2024-04-14 | Discharge: 2024-04-14 | Disposition: A | Discharge status: Home / Self Care | Payer: Self-pay | Source: Ambulatory Visit | Attending: Family Medicine | Admitting: Family Medicine

## 2024-04-14 ENCOUNTER — Ambulatory Visit (INDEPENDENT_AMBULATORY_CARE_PROVIDER_SITE_OTHER): Payer: Self-pay | Admitting: Family Medicine

## 2024-04-14 VITALS — BP 125/71 | HR 101 | Temp 98.4°F | Ht 73.0 in | Wt 169.6 lb

## 2024-04-14 DIAGNOSIS — F12188 Cannabis abuse with other cannabis-induced disorder: Secondary | ICD-10-CM

## 2024-04-14 DIAGNOSIS — Z7689 Persons encountering health services in other specified circumstances: Secondary | ICD-10-CM

## 2024-04-14 DIAGNOSIS — F411 Generalized anxiety disorder: Secondary | ICD-10-CM

## 2024-04-14 DIAGNOSIS — Z113 Encounter for screening for infections with a predominantly sexual mode of transmission: Secondary | ICD-10-CM

## 2024-04-14 DIAGNOSIS — F332 Major depressive disorder, recurrent severe without psychotic features: Secondary | ICD-10-CM

## 2024-04-14 MED ORDER — ONDANSETRON 4 MG PO TBDP
4.0000 mg | ORAL_TABLET | Freq: Three times a day (TID) | ORAL | 1 refills | Status: AC | PRN
Start: 2024-04-14 — End: ?

## 2024-04-14 NOTE — Patient Instructions (Signed)
  VISIT SUMMARY: Today, you came in for STD screening and to establish care. 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  YOUR PLAN: -SCREENING FOR SEXUALLY TRANSMITTED INFECTIONS: You requested screening for sexually transmitted infections (STIs) even though you do not have specific symptoms. We will conduct urine and blood tests for hepatitis, HIV, syphilis, gonorrhea, chlamydia, and trichomonas. Use condoms to prevent STD contraction.   -CANNABIS-INDUCED CYCLICAL VOMITING SYNDROME: Cannabis-induced cyclical vomiting syndrome is a condition where cannabis use leads to repeated episodes of severe nausea and vomiting. You occasionally use THC but have not had significant vomiting recently. Zofran  helps manage your nausea, and we have provided a refill for it as needed.  -DEPRESSION AND ANXIETY DISORDER: You have shown signs of possible depression and anxiety, which are affecting your daily life. There is a family history of mental illness, and you prefer therapy over medication. We discussed the benefits of therapy and medication options, and you have been referred to therapy for further management.  INSTRUCTIONS: Please follow up with the lab for your STI screening tests. Continue using Zofran  as needed for nausea. Schedule an appointment with a therapist to help manage your depression and anxiety. If you have any questions or concerns, please do not hesitate to contact our office.

## 2024-04-15 LAB — RPR: RPR Ser Ql: NONREACTIVE

## 2024-04-15 LAB — HIV ANTIBODY (ROUTINE TESTING W REFLEX): HIV 1&2 Ab, 4th Generation: NONREACTIVE

## 2024-04-15 LAB — URINE CYTOLOGY ANCILLARY ONLY
Chlamydia: NEGATIVE
Comment: NEGATIVE
Comment: NEGATIVE
Comment: NORMAL
Neisseria Gonorrhea: NEGATIVE
Trichomonas: NEGATIVE

## 2024-04-15 LAB — HCV AB W REFLEX TO QUANT PCR: HCV Ab: NONREACTIVE

## 2024-04-15 LAB — HEPATITIS B SURFACE ANTIGEN: Hepatitis B Surface Ag: NONREACTIVE

## 2024-04-15 LAB — C. TRACHOMATIS/N. GONORRHOEAE RNA
C. trachomatis RNA, TMA: NOT DETECTED
N. gonorrhoeae RNA, TMA: NOT DETECTED

## 2024-04-15 LAB — HEPATITIS B SURFACE ANTIBODY,QUALITATIVE: Hep B S Ab: NONREACTIVE

## 2024-04-15 LAB — HEPATITIS B CORE ANTIBODY, TOTAL: Hep B Core Total Ab: NONREACTIVE

## 2024-04-15 LAB — HCV INTERPRETATION

## 2024-04-19 ENCOUNTER — Ambulatory Visit: Payer: Self-pay | Admitting: Family Medicine

## 2024-04-19 NOTE — Progress Notes (Signed)
 Assessment & Plan   Assessment/Plan:    Assessment & Plan Screening for sexually transmitted infections Requested STI screening without specific symptoms but expressed interest in testing. - Order urine and blood tests for hepatitis, HIV, syphilis, gonorrhea, chlamydia, and trichomonas.  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 Currently uses THC occasionally with no significant vomiting episodes. Zofran  effectively manages nausea. - Provide refill for Zofran  as needed.  Depression and anxiety disorder Screening indicated possible depression and anxiety. Reports life difficulties due to symptoms. No prior treatment. Family history of mental illness. Prefers therapy over medication due to concerns about antidepressant side effects. Discussed therapy benefits and medication options, including tricyclic antidepressants like amitriptyline, which can aid mood and nausea, but he prefers therapy. - Refer to therapy for depression and anxiety management.      Medications Discontinued During This Encounter  Medication Reason   erythromycin  ophthalmic ointment    omeprazole  (PRILOSEC ) 20 MG capsule    sucralfate  (CARAFATE ) 1 g tablet    ondansetron  (ZOFRAN ) 4 MG tablet    acetaminophen  (TYLENOL ) 325 MG tablet    ondansetron  (ZOFRAN -ODT) 4 MG disintegrating tablet Reorder    Return if symptoms worsen or fail to improve.        Subjective:   Encounter date: 04/14/2024  [name] is a 33 y.o. male who has Inguinal hernia unilateral, non-recurrent; Acute gastroenteritis; Marijuana use; Cystic acne; Intractable vomiting with nausea; Cannabis hyperemesis syndrome concurrent with and due to cannabis abuse (HCC); Severe episode of recurrent major depressive disorder, without psychotic features (HCC); and GAD (generalized anxiety disorder) on their problem list..   He  has a past medical history of Anxiety, Hernia  due to adhesion with obstruction (2013), and Skin cysts, generalized (09/15/2012).SABRA   He presents with chief complaint of Establish Care (Not fasting wanting to do std test not specific) .   Discussed the use of AI scribe software for clinical note transcription with the patient, who gave verbal consent to proceed.  History of Present Illness [name] is a 33 year old male who presents for STD screening and to establish care.  He is interested in testing for various STDs including hepatitis, HIV, syphilis, gonorrhea, chlamydia, and trichomonas.  He has a history of cannabis-induced cyclical vomiting syndrome and hyperemesis. He occasionally uses THC, but it has not triggered significant vomiting recently. He uses Zofran , which helps manage his symptoms. He experiences occasional nausea but no significant vomiting.  He has flagged positive for possible depression and anxiety on a questionnaire, noting that life is 'pretty crazy right now'. He describes the difficulty as 'somewhat difficult' in terms of managing daily life activities. There is a family history of possible depression or anxiety in his mother, but no diagnosed bipolar disorder or other mental illnesses.  He has no personal history of other substance use beyond marijuana and reports very rare alcohol use and occasional cigarette smoking. He works as a Nutritional therapist.     ROS  Past Surgical History:  Procedure Laterality Date   CYST EXCISION  2011/2012   INGUINAL HERNIA REPAIR Left 09/24/2012   Procedure: HERNIA REPAIR INGUINAL ADULT;  Surgeon: Debby LABOR. Cornett, MD;  Location: [facility];  Service: General;  Laterality: Left;   INSERTION OF MESH Left 09/24/2012   Procedure: INSERTION OF MESH;  Surgeon: Debby LABOR. Cornett, MD;  Location: [facility];  Service: General;  Laterality: Left;    Outpatient Medications Prior to Visit  Medication Sig Dispense Refill  ondansetron  (ZOFRAN ) 4 MG tablet Take 4 mg by mouth as needed for  nausea.     ondansetron  (ZOFRAN -ODT) 4 MG disintegrating tablet Dissolve 1 tablet (4 mg total) by mouth every 8 (eight) hours as needed. 20 tablet 0   omeprazole  (PRILOSEC  OTC) 20 MG tablet Take 1 tablet (20 mg total) by mouth daily. 90 tablet 1   acetaminophen  (TYLENOL ) 325 MG tablet Take 2 tablets (650 mg total) by mouth every 6 (six) hours as needed. (Patient not taking: Reported on 03/29/2021)     erythromycin  ophthalmic ointment Place a 1/2 inch ribbon of ointment into the lower eyelid. (Patient not taking: Reported on 04/14/2024) 3.5 g 0   omeprazole  (PRILOSEC ) 20 MG capsule Take 1 capsule (20 mg total) by mouth daily for 14 days. (Patient not taking: Reported on 04/14/2024) 14 capsule 0   sucralfate  (CARAFATE ) 1 g tablet Take 1 tablet (1 g total) by mouth 4 (four) times daily -  with meals and at bedtime for 14 days. (Patient not taking: Reported on 04/14/2024) 56 tablet 0   No facility-administered medications prior to visit.    Family History  Problem Relation Age of Onset   Healthy Mother    Healthy Father    Healthy Brother     Social History   Socioeconomic History   Marital status: Single    Spouse name: Not on file   Number of children: Not on file   Years of education: Not on file   Highest education level: Not on file  Occupational History   Not on file  Tobacco Use   Smoking status: Former    Current packs/day: 0.00    Average packs/day: 0.3 packs/day for 3.0 years (0.8 ttl pk-yrs)    Types: Cigarettes    Start date: 10/20/2010    Quit date: 10/19/2013    Years since quitting: 10.5   Smokeless tobacco: Never  Vaping Use   Vaping status: Never Used  Substance and Sexual Activity   Alcohol use: Yes    Alcohol/week: 2.0 standard drinks of alcohol    Types: 2 Cans of beer per week    Comment: Social-beer 2 per weekend   Drug use: Yes    Types: Marijuana   Sexual activity: Yes  Other Topics Concern   Not on file  Social History Narrative   Not on file   Social  Drivers of Health   Financial Resource Strain: Not on file  Food Insecurity: No Food Insecurity (05/10/2023)   Hunger Vital Sign    Worried About Running Out of Food in the Last Year: Never true    Ran Out of Food in the Last Year: Never true  Transportation Needs: No Transportation Needs (05/10/2023)   PRAPARE - Administrator, Civil Service (Medical): No    Lack of Transportation (Non-Medical): No  Physical Activity: Not on file  Stress: Not on file  Social Connections: Unknown (05/27/2022)   [facility]   Social Network    Social Network: Not on file  Intimate Partner Violence: Not At Risk (05/10/2023)   Humiliation, Afraid, Rape, and Kick questionnaire    Fear of Current or Ex-Partner: No    Emotionally Abused: No    Physically Abused: No    Sexually Abused: No  Objective:  Physical Exam: BP 125/71   Pulse (!) 101   Temp 98.4 F (36.9 C) (Temporal)   Ht 6' 1 (1.854 m)   Wt 169 lb 9.6 oz (76.9 kg)   SpO2 96%   BMI 22.38 kg/m    Physical Exam GENERAL: Alert, cooperative, well developed, no acute distress. HEENT: Normocephalic, normal oropharynx, moist mucous membranes. CHEST: Clear to auscultation bilaterally, no wheezes, rhonchi, or crackles. Lungs normal. CARDIOVASCULAR: Normal heart rate and rhythm, S1 and S2 normal without murmurs. Heart normal. ABDOMEN: Soft, non-tender, non-distended, without organomegaly, normal bowel sounds. Abdomen normal. EXTREMITIES: No cyanosis or edema. NEUROLOGICAL: Cranial nerves grossly intact, moves all extremities without gross motor or sensory deficit.   Physical Exam  No results found.         Beverley Adine Hummer, MD, MS
